# Patient Record
Sex: Male | Born: 1960 | Race: Black or African American | Hispanic: No | Marital: Married | Smoking: Current every day smoker
Health system: Southern US, Community
[De-identification: ages and names within clinical notes are randomized; demographics above are authoritative.]

## PROBLEM LIST (undated history)

## (undated) DIAGNOSIS — I219 Acute myocardial infarction, unspecified: Secondary | ICD-10-CM

## (undated) DIAGNOSIS — M1990 Unspecified osteoarthritis, unspecified site: Secondary | ICD-10-CM

## (undated) DIAGNOSIS — K219 Gastro-esophageal reflux disease without esophagitis: Secondary | ICD-10-CM

## (undated) DIAGNOSIS — H409 Unspecified glaucoma: Secondary | ICD-10-CM

## (undated) DIAGNOSIS — I2510 Atherosclerotic heart disease of native coronary artery without angina pectoris: Secondary | ICD-10-CM

## (undated) DIAGNOSIS — J302 Other seasonal allergic rhinitis: Secondary | ICD-10-CM

## (undated) DIAGNOSIS — J45909 Unspecified asthma, uncomplicated: Secondary | ICD-10-CM

## (undated) DIAGNOSIS — I639 Cerebral infarction, unspecified: Secondary | ICD-10-CM

## (undated) DIAGNOSIS — T7840XA Allergy, unspecified, initial encounter: Secondary | ICD-10-CM

## (undated) HISTORY — DX: Acute myocardial infarction, unspecified: I21.9

## (undated) HISTORY — DX: Atherosclerotic heart disease of native coronary artery without angina pectoris: I25.10

## (undated) HISTORY — DX: Allergy, unspecified, initial encounter: T78.40XA

## (undated) HISTORY — PX: STENT PLACEMENT VASCULAR (ARMC HX): HXRAD1737

## (undated) HISTORY — PX: HERNIA REPAIR: SHX51

## (undated) HISTORY — DX: Unspecified glaucoma: H40.9

## (undated) HISTORY — PX: EYE SURGERY: SHX253

## (undated) HISTORY — DX: Cerebral infarction, unspecified: I63.9

## (undated) HISTORY — DX: Gastro-esophageal reflux disease without esophagitis: K21.9

## (undated) HISTORY — PX: CARDIAC SURGERY: SHX584

## (undated) HISTORY — DX: Other seasonal allergic rhinitis: J30.2

---

## 2009-02-22 ENCOUNTER — Ambulatory Visit (HOSPITAL_COMMUNITY): Admission: RE | Admit: 2009-02-22 | Discharge: 2009-02-22 | Payer: Self-pay | Admitting: Physical Therapy

## 2009-02-26 ENCOUNTER — Ambulatory Visit (HOSPITAL_COMMUNITY): Admission: RE | Admit: 2009-02-26 | Discharge: 2009-02-26 | Payer: Self-pay | Admitting: Interventional Radiology

## 2009-04-27 ENCOUNTER — Ambulatory Visit: Payer: Self-pay | Admitting: Cardiology

## 2009-04-27 ENCOUNTER — Ambulatory Visit (HOSPITAL_COMMUNITY): Admission: RE | Admit: 2009-04-27 | Discharge: 2009-04-27 | Payer: Self-pay | Admitting: Cardiology

## 2009-06-08 ENCOUNTER — Ambulatory Visit (HOSPITAL_COMMUNITY): Admission: RE | Admit: 2009-06-08 | Discharge: 2009-06-08 | Payer: Self-pay | Admitting: Interventional Radiology

## 2010-04-22 LAB — BASIC METABOLIC PANEL
BUN: 8 mg/dL (ref 6–23)
Calcium: 10.3 mg/dL (ref 8.4–10.5)
GFR calc non Af Amer: >60 mL/min (ref >60)

## 2010-04-22 LAB — DIFFERENTIAL
Basophils Absolute: 0.1 10*3/uL (ref 0.0–0.1)
Eosinophils Absolute: 0.1 10*3/uL (ref 0.0–0.7)
Eosinophils Relative: 1 % (ref 0–5)
Lymphs Abs: 3.4 10*3/uL (ref 0.7–4.0)
Neutro Abs: 9.1 10*3/uL — ABNORMAL HIGH (ref 1.7–7.7)
Neutrophils Relative %: 69 % (ref 43–77)

## 2010-04-22 LAB — PROTIME-INR: Prothrombin Time: 13.2 seconds (ref 11.6–15.2)

## 2010-04-22 LAB — CBC
Platelets: 170 10*3/uL (ref 150–400)
RBC: 4.96 MIL/uL (ref 4.22–5.81)
WBC: 13.3 10*3/uL — ABNORMAL HIGH (ref 4.0–10.5)

## 2010-04-23 LAB — CREATININE, SERUM: GFR calc Af Amer: >60 mL/min (ref >60)

## 2010-04-29 LAB — CBC
Hemoglobin: 14.7 g/dL (ref 13.0–17.0)
MCV: 93.2 fL (ref 78.0–100.0)
Platelets: 168 10*3/uL (ref 150–400)
RDW: 12.5 % (ref 11.5–15.5)

## 2010-04-29 LAB — PROTIME-INR: Prothrombin Time: 13 seconds (ref 11.6–15.2)

## 2010-04-29 LAB — BASIC METABOLIC PANEL
BUN: 12 mg/dL (ref 6–23)
CO2: 29 mEq/L (ref 19–32)
Chloride: 105 mEq/L (ref 96–112)
Creatinine, Ser: 0.86 mg/dL (ref 0.4–1.5)
Potassium: 4.1 mEq/L (ref 3.5–5.1)

## 2010-07-19 ENCOUNTER — Other Ambulatory Visit (HOSPITAL_COMMUNITY): Payer: Self-pay | Admitting: Interventional Radiology

## 2010-07-19 DIAGNOSIS — I771 Stricture of artery: Secondary | ICD-10-CM

## 2010-07-22 ENCOUNTER — Other Ambulatory Visit (HOSPITAL_COMMUNITY): Payer: Self-pay | Admitting: Interventional Radiology

## 2010-07-22 ENCOUNTER — Ambulatory Visit (HOSPITAL_COMMUNITY)
Admission: RE | Admit: 2010-07-22 | Discharge: 2010-07-22 | Disposition: A | Discharge status: Home / Self Care | Payer: BC Managed Care – PPO | Source: Ambulatory Visit | Attending: Interventional Radiology | Admitting: Interventional Radiology

## 2010-07-22 DIAGNOSIS — I771 Stricture of artery: Secondary | ICD-10-CM

## 2010-07-24 ENCOUNTER — Other Ambulatory Visit (HOSPITAL_COMMUNITY): Payer: Self-pay | Admitting: Interventional Radiology

## 2010-07-24 ENCOUNTER — Ambulatory Visit (HOSPITAL_COMMUNITY)
Admission: RE | Admit: 2010-07-24 | Discharge: 2010-07-24 | Disposition: A | Discharge status: Home / Self Care | Payer: BC Managed Care – PPO | Source: Ambulatory Visit | Attending: Interventional Radiology | Admitting: Interventional Radiology

## 2010-07-24 DIAGNOSIS — J449 Chronic obstructive pulmonary disease, unspecified: Secondary | ICD-10-CM | POA: Insufficient documentation

## 2010-07-24 DIAGNOSIS — I658 Occlusion and stenosis of other precerebral arteries: Secondary | ICD-10-CM | POA: Insufficient documentation

## 2010-07-24 DIAGNOSIS — R7309 Other abnormal glucose: Secondary | ICD-10-CM | POA: Insufficient documentation

## 2010-07-24 DIAGNOSIS — I6529 Occlusion and stenosis of unspecified carotid artery: Secondary | ICD-10-CM | POA: Insufficient documentation

## 2010-07-24 DIAGNOSIS — Z79899 Other long term (current) drug therapy: Secondary | ICD-10-CM | POA: Insufficient documentation

## 2010-07-24 DIAGNOSIS — I771 Stricture of artery: Secondary | ICD-10-CM

## 2010-07-24 DIAGNOSIS — Z7982 Long term (current) use of aspirin: Secondary | ICD-10-CM | POA: Insufficient documentation

## 2010-07-24 DIAGNOSIS — Z7902 Long term (current) use of antithrombotics/antiplatelets: Secondary | ICD-10-CM | POA: Insufficient documentation

## 2010-07-24 DIAGNOSIS — F17200 Nicotine dependence, unspecified, uncomplicated: Secondary | ICD-10-CM | POA: Insufficient documentation

## 2010-07-24 DIAGNOSIS — J4489 Other specified chronic obstructive pulmonary disease: Secondary | ICD-10-CM | POA: Insufficient documentation

## 2010-07-24 DIAGNOSIS — R51 Headache: Secondary | ICD-10-CM | POA: Insufficient documentation

## 2010-07-24 LAB — CBC
HCT: 43.9 % (ref 39.0–52.0)
MCH: 30.6 pg (ref 26.0–34.0)
MCHC: 35.3 g/dL (ref 30.0–36.0)
RBC: 5.06 MIL/uL (ref 4.22–5.81)

## 2010-07-24 LAB — PROTIME-INR: Prothrombin Time: 12.8 seconds (ref 11.6–15.2)

## 2010-07-24 LAB — POCT I-STAT, CHEM 8
Calcium, Ion: 1.2 mmol/L (ref 1.12–1.32)
Glucose, Bld: 157 mg/dL — ABNORMAL HIGH (ref 70–99)
HCT: 48 % (ref 39.0–52.0)
TCO2: 24 mmol/L (ref 0–100)

## 2010-07-24 MED ORDER — IOHEXOL 300 MG/ML  SOLN
150.0000 mL | Freq: Once | INTRAMUSCULAR | Status: AC | PRN
  Administered 2010-07-24: 80 mL via INTRAVENOUS

## 2010-07-25 ENCOUNTER — Other Ambulatory Visit (HOSPITAL_COMMUNITY): Payer: Self-pay | Admitting: Interventional Radiology

## 2010-07-25 DIAGNOSIS — I771 Stricture of artery: Secondary | ICD-10-CM

## 2010-07-30 ENCOUNTER — Encounter (HOSPITAL_COMMUNITY)
Admission: RE | Admit: 2010-07-30 | Discharge: 2010-07-30 | Disposition: A | Discharge status: Home / Self Care | Payer: BC Managed Care – PPO | Source: Ambulatory Visit | Attending: Interventional Radiology | Admitting: Interventional Radiology

## 2010-07-30 ENCOUNTER — Other Ambulatory Visit (HOSPITAL_COMMUNITY): Payer: Self-pay | Admitting: Interventional Radiology

## 2010-07-30 ENCOUNTER — Ambulatory Visit (HOSPITAL_COMMUNITY)
Admission: RE | Admit: 2010-07-30 | Discharge: 2010-07-30 | Disposition: A | Discharge status: Home / Self Care | Payer: BC Managed Care – PPO | Source: Ambulatory Visit | Attending: Interventional Radiology | Admitting: Interventional Radiology

## 2010-07-30 DIAGNOSIS — Z01812 Encounter for preprocedural laboratory examination: Secondary | ICD-10-CM | POA: Insufficient documentation

## 2010-07-30 DIAGNOSIS — I6509 Occlusion and stenosis of unspecified vertebral artery: Secondary | ICD-10-CM

## 2010-07-30 DIAGNOSIS — Z01818 Encounter for other preprocedural examination: Secondary | ICD-10-CM | POA: Insufficient documentation

## 2010-07-30 DIAGNOSIS — I10 Essential (primary) hypertension: Secondary | ICD-10-CM | POA: Insufficient documentation

## 2010-07-30 LAB — PROTIME-INR: Prothrombin Time: 13 seconds (ref 11.6–15.2)

## 2010-07-30 LAB — BASIC METABOLIC PANEL
BUN: 9 mg/dL (ref 6–23)
CO2: 28 mEq/L (ref 19–32)
GFR calc non Af Amer: >60 mL/min (ref >60)
Glucose, Bld: 69 mg/dL — ABNORMAL LOW (ref 70–99)
Potassium: 3.5 mEq/L (ref 3.5–5.1)
Sodium: 139 mEq/L (ref 135–145)

## 2010-07-30 LAB — SURGICAL PCR SCREEN: Staphylococcus aureus: NEGATIVE

## 2010-07-30 LAB — DIFFERENTIAL
Basophils Relative: 0 % (ref 0–1)
Eosinophils Absolute: 0.4 10*3/uL (ref 0.0–0.7)
Eosinophils Relative: 6 % — ABNORMAL HIGH (ref 0–5)
Neutrophils Relative %: 40 % — ABNORMAL LOW (ref 43–77)

## 2010-07-30 LAB — CBC
MCH: 30.9 pg (ref 26.0–34.0)
Platelets: 179 10*3/uL (ref 150–400)
RBC: 4.98 MIL/uL (ref 4.22–5.81)
RDW: 12.6 % (ref 11.5–15.5)
WBC: 7.4 10*3/uL (ref 4.0–10.5)

## 2010-07-30 LAB — PLATELET INHIBITION P2Y12
P2Y12 % Inhibition: 4 %
Platelet Function  P2Y12: 209 [PRU] (ref 194–418)

## 2010-07-31 ENCOUNTER — Inpatient Hospital Stay (HOSPITAL_COMMUNITY)
Admission: RE | Admit: 2010-07-31 | Discharge: 2010-08-01 | DRG: 838 | Disposition: A | Discharge status: Home / Self Care | Payer: BC Managed Care – PPO | Source: Ambulatory Visit | Attending: Interventional Radiology | Admitting: Interventional Radiology

## 2010-07-31 ENCOUNTER — Ambulatory Visit (HOSPITAL_COMMUNITY)
Admission: RE | Admit: 2010-07-31 | Discharge: 2010-07-31 | Disposition: A | Discharge status: Home / Self Care | Payer: BC Managed Care – PPO | Source: Ambulatory Visit | Attending: Interventional Radiology | Admitting: Interventional Radiology

## 2010-07-31 DIAGNOSIS — F17200 Nicotine dependence, unspecified, uncomplicated: Secondary | ICD-10-CM | POA: Diagnosis present

## 2010-07-31 DIAGNOSIS — I10 Essential (primary) hypertension: Secondary | ICD-10-CM | POA: Diagnosis present

## 2010-07-31 DIAGNOSIS — J4489 Other specified chronic obstructive pulmonary disease: Secondary | ICD-10-CM | POA: Diagnosis present

## 2010-07-31 DIAGNOSIS — I6509 Occlusion and stenosis of unspecified vertebral artery: Principal | ICD-10-CM | POA: Diagnosis present

## 2010-07-31 DIAGNOSIS — J449 Chronic obstructive pulmonary disease, unspecified: Secondary | ICD-10-CM | POA: Diagnosis present

## 2010-07-31 DIAGNOSIS — Z7902 Long term (current) use of antithrombotics/antiplatelets: Secondary | ICD-10-CM

## 2010-07-31 DIAGNOSIS — E785 Hyperlipidemia, unspecified: Secondary | ICD-10-CM | POA: Diagnosis present

## 2010-07-31 DIAGNOSIS — Z7982 Long term (current) use of aspirin: Secondary | ICD-10-CM

## 2010-07-31 DIAGNOSIS — I771 Stricture of artery: Secondary | ICD-10-CM

## 2010-07-31 DIAGNOSIS — Z8673 Personal history of transient ischemic attack (TIA), and cerebral infarction without residual deficits: Secondary | ICD-10-CM

## 2010-07-31 LAB — POCT ACTIVATED CLOTTING TIME
Activated Clotting Time: 188 seconds
Activated Clotting Time: 193 s

## 2010-07-31 LAB — CBC
MCH: 30.1 pg (ref 26.0–34.0)
MCHC: 34.3 g/dL (ref 30.0–36.0)
MCV: 87.6 fL (ref 78.0–100.0)
Platelets: 157 10*3/uL (ref 150–400)
RBC: 4.12 MIL/uL — ABNORMAL LOW (ref 4.22–5.81)
RDW: 12.6 % (ref 11.5–15.5)

## 2010-07-31 LAB — HEPARIN LEVEL (UNFRACTIONATED): Heparin Unfractionated: <0.1 IU/mL — ABNORMAL LOW (ref 0.30–0.70)

## 2010-07-31 LAB — GLUCOSE, CAPILLARY: Glucose-Capillary: 199 mg/dL — ABNORMAL HIGH (ref 70–99)

## 2010-07-31 MED ORDER — IOHEXOL 300 MG/ML  SOLN
300.0000 mL | Freq: Once | INTRAMUSCULAR | Status: AC | PRN
  Administered 2010-07-31: 150 mL via INTRAVENOUS

## 2010-08-01 LAB — BASIC METABOLIC PANEL
BUN: 11 mg/dL (ref 6–23)
CO2: 25 mEq/L (ref 19–32)
GFR calc non Af Amer: >60 mL/min (ref >60)
Glucose, Bld: 294 mg/dL — ABNORMAL HIGH (ref 70–99)
Potassium: 3.6 mEq/L (ref 3.5–5.1)

## 2010-08-01 LAB — PROTIME-INR
INR: 0.95 (ref 0.00–1.49)
Prothrombin Time: 12.9 seconds (ref 11.6–15.2)

## 2010-08-01 LAB — CBC
MCH: 30.4 pg (ref 26.0–34.0)
MCHC: 34.1 g/dL (ref 30.0–36.0)
Platelets: 156 10*3/uL (ref 150–400)
RDW: 12.8 % (ref 11.5–15.5)

## 2010-08-08 NOTE — H&P (Signed)
  NAME:  Kyle Gray, Kyle Gray NO.:  [id number]  MEDICAL RECORD NO.:  [id number]  LOCATION:  3106                         FACILITY:  [facility]  PHYSICIAN:  Dhanvi Boesen K. Ishana Blades, M.D.DATE OF BIRTH:  08-25-1960  DATE OF ADMISSION:  07/31/2010 DATE OF DISCHARGE:                             HISTORY & PHYSICAL   SUBJECTIVE:  A 50 year old male with history of TIAs/left cerebrovascular accident.  He has right vertebral artery stenosis.  He is scheduled today for cerebral arteriogram and probable right vertebral artery angioplasty/stent placement with [name].  The patient is aware of procedure benefits and risks.  He is agreeable to proceed.  PAST MEDICAL HISTORY: 1. CVA. 2. TIAs. 3. High lipids or hyperlipidemia. 4. Hypertension. 5. Smoker.  SURGICAL HISTORY:  Right eye surgery and wisdom teeth removal.  MEDICATIONS: 1. Ranitidine 150 mg. 2. Plavix 75 mg. 3. Norvasc 10 mg. 4. Lisinopril 5 mg. 5. Hydrochlorothiazide 25 mg. 6. Foltabs 800 mg. 7. Crestor 20 mg. 8. Aspirin 81 mg. 9. Albuterol inhaler as needed.  ALLERGIES:  None.  OBJECTIVE:  HEENT:  Extraocular movements intact.  Face is symmetrical; smile is equal bilaterally. GENERAL:  Alert and oriented; appropriate. HEART:  Regular rate and rhythm without murmur. LUNGS:  Clear to auscultation. ABDOMEN:  Positive bowel sounds; nontender; no masses. EXTREMITIES:  Full range of motion; gait is steady.  ASSESSMENT:  Right vertebral artery stenosis.  PLAN: 1. Cerebral arteriogram and possible angioplasty and/or stent     placement with [name]. 2. The patient to be admitted to Neuro ICU if kept overnight and     probably discharge following day.     [place] [facility] Carleene Mains, P.A.   ______________________________ [place] Silos [name], M.D.    PAT/MEDQ  D:  07/31/2010  T:  08/01/2010  Job:  [id number]  Electronically Signed by Ralene Muskrat P.A. on 08/02/2010 04:42:05 PM Electronically Signed by  Julieanne Cotton M.D. on 08/08/2010 11:27:59 AM

## 2010-08-08 NOTE — Discharge Summary (Signed)
  NAME:  Kyle Gray, PETITJEAN NO.:  [id number]  MEDICAL RECORD NO.:  [id number]  LOCATION:                                 FACILITY:  PHYSICIAN:  Ryelan Kazee K. Bari Leib, M.D.DATE OF BIRTH:  07/14/1960  DATE OF ADMISSION: DATE OF DISCHARGE:                              DISCHARGE SUMMARY   SUBJECTIVE:  A 50 year old male with right vertebral artery stenosis. History of stroke and TIA.  A right vertebral artery stent was placed on July 31, 2010, by Dr. Julieanne Cotton.  The patient tolerated the procedure well and without complication.  Overnight stay was uncomplicated.  He is eating well with no nausea or vomiting.  He slept well.  Complains of no headache or no pain or no visual abnormalities. He is urinating well.  OBJECTIVE:  VITAL SIGNS:  Temperatures 99 degrees, heart rate is 72, respirations 18, 99% on room air, blood pressure 135/67.  Urine output was 1 liter on July 31, 2010, and 1.5 liters on August 01, 2010.  Urine is yellow and clear.  DISCHARGE LABORATORY DATA:  BUN 11, creatinine 0.8, potassium 3.6. White count is 16.3 (the patient was premedicated with steroids for CONTRAST allergy), hemoglobin 12.9, hematocrit 37.8, platelets 156.  Pro time 12.9, INR 0.95, PTT is 29.  Alert and oriented; extraocular movements intact; appropriate.  Face is symmetrical, smile is equal, puffs cheeks equally, tongue was midline. Good grip bilaterally and equal.  Good strength bilaterally and equal. Pushes and pulls both feet equally bilaterally.  Right groin is nontender.  No bleeding, no hematoma.  Right foot has 2+ pulses.  MEDICATIONS:  Discharge medications: 1. Albuterol inhaler 2 puffs every 4 hours as needed. 2. Aspirin 81 mg. 3. Crestor 20 mg. 4. Foltabs 800 mg. 5. Hydrochlorothiazide 25 mg. 6. Lisinopril 5 mg. 7. Norvasc 10 mg. 8. Plavix 75 mg. 9. Ranitidine 150 mg.  ASSESSMENT:  Right vertebral artery stenosis/stent placement on July 31, 2010, with Dr.  [name].  DISCHARGE INSTRUCTIONS:  No driving for 2 weeks.  No lifting for 2 weeks.  No stooping or sudden movement of neck for two weeks.  The patient may bathe today.  Diet is low fat and low sodium.  He is to return to clinic in 2 weeks to see [name] in followup.  Scheduler will call the patient with time and date of that followup appointment.  DISCHARGE DIAGNOSIS:  Right vertebral artery stenosis with right vertebral artery stent placement on July 31, 2010, by [name].     [place] [facility] Carleene Mains, P.A.   ______________________________ [place] Silos [name], M.D.    PAT/MEDQ  D:  08/01/2010  T:  08/02/2010  Job:  [id number]  Electronically Signed by Ralene Muskrat P.A. on 08/02/2010 04:43:45 PM Electronically Signed by Julieanne Cotton M.D. on 08/08/2010 11:28:02 AM

## 2010-08-15 ENCOUNTER — Other Ambulatory Visit: Payer: Self-pay | Admitting: Physician Assistant

## 2010-08-15 ENCOUNTER — Other Ambulatory Visit (HOSPITAL_COMMUNITY): Payer: Self-pay | Admitting: Interventional Radiology

## 2010-08-15 ENCOUNTER — Ambulatory Visit (HOSPITAL_COMMUNITY)
Admit: 2010-08-15 | Discharge: 2010-08-15 | Disposition: A | Discharge status: Home / Self Care | Payer: BC Managed Care – PPO | Source: Ambulatory Visit | Attending: Interventional Radiology | Admitting: Interventional Radiology

## 2010-08-15 ENCOUNTER — Ambulatory Visit (HOSPITAL_COMMUNITY)
Admission: RE | Admit: 2010-08-15 | Discharge: 2010-08-15 | Disposition: A | Discharge status: Home / Self Care | Payer: BC Managed Care – PPO | Source: Ambulatory Visit | Attending: Interventional Radiology | Admitting: Interventional Radiology

## 2010-08-15 DIAGNOSIS — I739 Peripheral vascular disease, unspecified: Secondary | ICD-10-CM

## 2010-08-15 DIAGNOSIS — I723 Aneurysm of iliac artery: Secondary | ICD-10-CM | POA: Insufficient documentation

## 2010-08-15 DIAGNOSIS — I708 Atherosclerosis of other arteries: Secondary | ICD-10-CM | POA: Insufficient documentation

## 2010-08-15 MED ORDER — GADOBENATE DIMEGLUMINE 529 MG/ML IV SOLN
30.0000 mL | Freq: Once | INTRAVENOUS | Status: AC | PRN
  Administered 2010-08-15: 30 mL via INTRAVENOUS

## 2011-01-06 ENCOUNTER — Encounter (HOSPITAL_COMMUNITY): Payer: Self-pay

## 2011-01-06 NOTE — Progress Notes (Signed)
Patient is scheduled for an US of the corotids at [place] on 01-15-11.

## 2013-04-06 DIAGNOSIS — IMO0002 Reserved for concepts with insufficient information to code with codable children: Secondary | ICD-10-CM | POA: Insufficient documentation

## 2013-04-06 DIAGNOSIS — I10 Essential (primary) hypertension: Secondary | ICD-10-CM

## 2013-04-06 DIAGNOSIS — I6789 Other cerebrovascular disease: Secondary | ICD-10-CM | POA: Insufficient documentation

## 2013-04-06 HISTORY — DX: Reserved for concepts with insufficient information to code with codable children: IMO0002

## 2013-04-06 HISTORY — DX: Essential (primary) hypertension: I10

## 2013-04-06 HISTORY — DX: Other cerebrovascular disease: I67.89

## 2013-06-03 DIAGNOSIS — E785 Hyperlipidemia, unspecified: Secondary | ICD-10-CM | POA: Insufficient documentation

## 2013-06-03 HISTORY — DX: Hyperlipidemia, unspecified: E78.5

## 2014-10-18 DIAGNOSIS — I471 Supraventricular tachycardia, unspecified: Secondary | ICD-10-CM | POA: Insufficient documentation

## 2014-10-18 DIAGNOSIS — I2510 Atherosclerotic heart disease of native coronary artery without angina pectoris: Secondary | ICD-10-CM | POA: Insufficient documentation

## 2014-10-18 HISTORY — DX: Supraventricular tachycardia, unspecified: I47.10

## 2014-10-18 HISTORY — DX: Supraventricular tachycardia: I47.1

## 2014-10-18 HISTORY — DX: Atherosclerotic heart disease of native coronary artery without angina pectoris: I25.10

## 2015-05-14 DIAGNOSIS — M542 Cervicalgia: Secondary | ICD-10-CM | POA: Insufficient documentation

## 2015-05-14 DIAGNOSIS — M5116 Intervertebral disc disorders with radiculopathy, lumbar region: Secondary | ICD-10-CM

## 2015-05-14 DIAGNOSIS — M545 Low back pain, unspecified: Secondary | ICD-10-CM | POA: Insufficient documentation

## 2015-05-14 DIAGNOSIS — G5600 Carpal tunnel syndrome, unspecified upper limb: Secondary | ICD-10-CM

## 2015-05-14 DIAGNOSIS — I6509 Occlusion and stenosis of unspecified vertebral artery: Secondary | ICD-10-CM | POA: Insufficient documentation

## 2015-05-14 HISTORY — DX: Carpal tunnel syndrome, unspecified upper limb: G56.00

## 2015-05-14 HISTORY — DX: Occlusion and stenosis of unspecified vertebral artery: I65.09

## 2015-05-14 HISTORY — DX: Intervertebral disc disorders with radiculopathy, lumbar region: M51.16

## 2015-05-14 HISTORY — DX: Cervicalgia: M54.2

## 2015-05-14 HISTORY — DX: Low back pain, unspecified: M54.50

## 2015-08-27 HISTORY — PX: COLONOSCOPY: SHX174

## 2015-12-14 HISTORY — PX: ESOPHAGOGASTRODUODENOSCOPY: SHX1529

## 2016-07-16 DIAGNOSIS — I723 Aneurysm of iliac artery: Secondary | ICD-10-CM | POA: Insufficient documentation

## 2016-07-16 DIAGNOSIS — I6523 Occlusion and stenosis of bilateral carotid arteries: Secondary | ICD-10-CM

## 2016-07-16 HISTORY — DX: Occlusion and stenosis of bilateral carotid arteries: I65.23

## 2016-07-16 HISTORY — DX: Aneurysm of iliac artery: I72.3

## 2017-05-27 ENCOUNTER — Telehealth: Payer: Self-pay

## 2017-05-27 NOTE — Telephone Encounter (Signed)
Pantoprazole 40 mg p.o. once a day 30 with 6 refills 

## 2017-05-27 NOTE — Telephone Encounter (Signed)
Patient wants refills on Pantoprazole, would you like to give any?  

## 2017-06-01 MED ORDER — PANTOPRAZOLE SODIUM 40 MG PO TBEC: 40.0000 mg | DELAYED_RELEASE_TABLET | Freq: Every day | ORAL | 6 refills | Status: DC

## 2017-06-01 NOTE — Telephone Encounter (Signed)
Sent refills to patients pharmacy.  

## 2018-06-11 ENCOUNTER — Telehealth: Payer: Self-pay | Admitting: *Deleted

## 2018-06-11 ENCOUNTER — Other Ambulatory Visit: Payer: Self-pay

## 2018-06-11 DIAGNOSIS — E876 Hypokalemia: Secondary | ICD-10-CM | POA: Insufficient documentation

## 2018-06-11 DIAGNOSIS — R0789 Other chest pain: Secondary | ICD-10-CM

## 2018-06-11 DIAGNOSIS — I119 Hypertensive heart disease without heart failure: Secondary | ICD-10-CM | POA: Insufficient documentation

## 2018-06-11 DIAGNOSIS — R059 Cough, unspecified: Secondary | ICD-10-CM

## 2018-06-11 DIAGNOSIS — R0602 Shortness of breath: Secondary | ICD-10-CM

## 2018-06-11 DIAGNOSIS — I739 Peripheral vascular disease, unspecified: Secondary | ICD-10-CM

## 2018-06-11 DIAGNOSIS — R05 Cough: Secondary | ICD-10-CM | POA: Insufficient documentation

## 2018-06-11 DIAGNOSIS — E119 Type 2 diabetes mellitus without complications: Secondary | ICD-10-CM

## 2018-06-11 HISTORY — DX: Shortness of breath: R06.02

## 2018-06-11 HISTORY — DX: Cough, unspecified: R05.9

## 2018-06-11 HISTORY — DX: Hypokalemia: E87.6

## 2018-06-11 HISTORY — DX: Hypertensive heart disease without heart failure: I11.9

## 2018-06-11 HISTORY — DX: Peripheral vascular disease, unspecified: I73.9

## 2018-06-11 HISTORY — DX: Type 2 diabetes mellitus without complications: E11.9

## 2018-06-11 HISTORY — DX: Other chest pain: R07.89

## 2018-06-11 NOTE — Telephone Encounter (Signed)
YOUR CARDIOLOGY TEAM HAS ARRANGED FOR AN E-VISIT FOR YOUR APPOINTMENT - PLEASE REVIEW IMPORTANT INFORMATION BELOW SEVERAL DAYS PRIOR TO YOUR APPOINTMENT  Due to the recent COVID-19 pandemic, we are transitioning in-person office visits to tele-medicine visits in an effort to decrease unnecessary exposure to our patients, their families, and staff. These visits are billed to your insurance just like a normal visit is. We also encourage you to sign up for MyChart if you have not already done so. You will need a smartphone if possible. For patients that do not have this, we can still complete the visit using a regular telephone but do prefer a smartphone to enable video when possible. You may have a family member that lives with you that can help. If possible, we also ask that you have a blood pressure cuff and scale at home to measure your blood pressure, heart rate and weight prior to your scheduled appointment. Patients with clinical needs that need an in-person evaluation and testing will still be able to come to the office if absolutely necessary. If you have any questions, feel free to call our office.     YOUR PROVIDER WILL BE USING THE FOLLOWING PLATFORM TO COMPLETE YOUR VISIT: Staff: Please delete this text and fill in MyChart/[email or web address]  . IF USING MYCHART - How to Download the MyChart App to Your SmartPhone   - If Apple, go to App Store and type in MyChart in the search bar and download the app. If Android, ask patient to go to Google Play Store and type in MyChart in the search bar and download the app. The app is free but as with any other app downloads, your phone may require you to verify saved payment information or Apple/Android password.  - You will need to then log into the app with your MyChart username and password, and select [place] as your healthcare provider to link the account.  - When it is time for your visit, go to the MyChart app, find appointments, and click Begin  Video Visit. Be sure to Select Allow for your device to access the Microphone and Camera for your visit. You will then be connected, and your provider will be with you shortly.  **If you have any issues connecting or need assistance, please contact MyChart service desk [phone] ([phone])**  **If using a computer, in order to ensure the best quality for your visit, you will need to use either of the following Internet Browsers: Google Chrome or Microsoft Edge**  . IF USING DOXIMITY or [email or web address].ME - The staff will give you instructions on receiving your link to join the meeting the day of your visit.      2-3 DAYS BEFORE YOUR APPOINTMENT  You will receive a telephone call from one of our [facility] team members - your caller ID may say "Unknown caller." If this is a video visit, we will walk you through how to get the video launched on your phone. We will remind you check your blood pressure, heart rate and weight prior to your scheduled appointment. If you have an Apple Watch or Kardia, please upload any pertinent ECG strips the day before or morning of your appointment to MyChart. Our staff will also make sure you have reviewed the consent and agree to move forward with your scheduled tele-health visit.     THE DAY OF YOUR APPOINTMENT  Approximately 15 minutes prior to your scheduled appointment, you will receive a telephone call from one of Heart[facility] team -   your caller ID may say "Unknown caller."  Our staff will confirm medications, vital signs for the day and any symptoms you may be experiencing. Please have this information available prior to the time of visit start. It may also be helpful for you to have a pad of paper and pen handy for any instructions given during your visit. They will also walk you through joining the smartphone meeting if this is a video visit.    CONSENT FOR TELE-HEALTH VISIT - PLEASE REVIEW  I hereby voluntarily request, consent and authorize [facility] and  its employed or contracted physicians, physician assistants, nurse practitioners or other licensed health care professionals (the Practitioner), to provide me with telemedicine health care services (the "Services") as deemed necessary by the treating Practitioner. I acknowledge and consent to receive the Services by the Practitioner via telemedicine. I understand that the telemedicine visit will involve communicating with the Practitioner through live audiovisual communication technology and the disclosure of certain medical information by electronic transmission. I acknowledge that I have been given the opportunity to request an in-person assessment or other available alternative prior to the telemedicine visit and am voluntarily participating in the telemedicine visit.  I understand that I have the right to withhold or withdraw my consent to the use of telemedicine in the course of my care at any time, without affecting my right to future care or treatment, and that the Practitioner or I may terminate the telemedicine visit at any time. I understand that I have the right to inspect all information obtained and/or recorded in the course of the telemedicine visit and may receive copies of available information for a reasonable fee.  I understand that some of the potential risks of receiving the Services via telemedicine include:  Marland Kitchen Delay or interruption in medical evaluation due to technological equipment failure or disruption; . Information transmitted may not be sufficient (e.g. poor resolution of images) to allow for appropriate medical decision making by the Practitioner; and/or  . In rare instances, security protocols could fail, causing a breach of personal health information.  Furthermore, I acknowledge that it is my responsibility to provide information about my medical history, conditions and care that is complete and accurate to the best of my ability. I acknowledge that Practitioner's advice,  recommendations, and/or decision may be based on factors not within their control, such as incomplete or inaccurate data provided by me or distortions of diagnostic images or specimens that may result from electronic transmissions. I understand that the practice of medicine is not an exact science and that Practitioner makes no warranties or guarantees regarding treatment outcomes. I acknowledge that I will receive a copy of this consent concurrently upon execution via email to the email address I last provided but may also request a printed copy by calling the office of [facility].    I understand that my insurance will be billed for this visit.   I have read or had this consent read to me. . I understand the contents of this consent, which adequately explains the benefits and risks of the Services being provided via telemedicine.  . I have been provided ample opportunity to ask questions regarding this consent and the Services and have had my questions answered to my satisfaction. . I give my informed consent for the services to be provided through the use of telemedicine in my medical care Pt gives consent to virtual visit.  By participating in this telemedicine visit I agree to the above.  Cardiac Questionnaire:    Since your last visit or hospitalization:    1. Have you been having new or worsening chest pain? Yes, that's why he was in the ED   2. Have you been having new or worsening shortness of breath? Yes 3. Have you been having new or worsening leg swelling, wt gain, or increase in abdominal girth (pants fitting more tightly)? no   4. Have you had any passing out spells? no    *A YES to any of these questions would result in the appointment being kept. *If all the answers to these questions are NO, we should indicate that given the current situation regarding the worldwide coronarvirus pandemic, at the recommendation of the CDC, we are looking to limit gatherings in our waiting  area, and thus will reschedule their appointment beyond four weeks from today.   _____________   COVID-19 Pre-Screening Questions:  . Do you currently have a fever? no (yes = cancel and refer to pcp for e-visit) . Have you recently travelled on a cruise, internationally, or to [place], [place], [place], [place], [place], or [place], [place] [employer]) ? no (yes = cancel, stay home, monitor symptoms, and contact pcp or initiate e-visit if symptoms develop) . Have you been in contact with someone that is currently pending confirmation of Covid19 testing or has been confirmed to have the Covid19 virus?  no (yes = cancel, stay home, away from tested individual, monitor symptoms, and contact pcp or initiate e-visit if symptoms develop) . Are you currently experiencing fatigue or cough? no (yes = pt should be prepared to have a mask placed at the time of their visit).

## 2018-06-17 ENCOUNTER — Encounter: Payer: Self-pay | Admitting: *Deleted

## 2018-06-17 ENCOUNTER — Encounter: Payer: Self-pay | Admitting: Cardiology

## 2018-06-17 ENCOUNTER — Telehealth (INDEPENDENT_AMBULATORY_CARE_PROVIDER_SITE_OTHER): Payer: BLUE CROSS/BLUE SHIELD | Admitting: Cardiology

## 2018-06-17 ENCOUNTER — Other Ambulatory Visit: Payer: Self-pay

## 2018-06-17 VITALS — BP 125/90 | HR 78 | Ht 68.0 in | Wt 231.0 lb

## 2018-06-17 DIAGNOSIS — E088 Diabetes mellitus due to underlying condition with unspecified complications: Secondary | ICD-10-CM

## 2018-06-17 DIAGNOSIS — L209 Atopic dermatitis, unspecified: Secondary | ICD-10-CM

## 2018-06-17 DIAGNOSIS — J029 Acute pharyngitis, unspecified: Secondary | ICD-10-CM

## 2018-06-17 DIAGNOSIS — E860 Dehydration: Secondary | ICD-10-CM

## 2018-06-17 DIAGNOSIS — R079 Chest pain, unspecified: Secondary | ICD-10-CM

## 2018-06-17 DIAGNOSIS — R0789 Other chest pain: Secondary | ICD-10-CM

## 2018-06-17 DIAGNOSIS — M5430 Sciatica, unspecified side: Secondary | ICD-10-CM | POA: Insufficient documentation

## 2018-06-17 DIAGNOSIS — I10 Essential (primary) hypertension: Secondary | ICD-10-CM | POA: Insufficient documentation

## 2018-06-17 DIAGNOSIS — R42 Dizziness and giddiness: Secondary | ICD-10-CM | POA: Insufficient documentation

## 2018-06-17 DIAGNOSIS — I679 Cerebrovascular disease, unspecified: Secondary | ICD-10-CM

## 2018-06-17 DIAGNOSIS — E782 Mixed hyperlipidemia: Secondary | ICD-10-CM

## 2018-06-17 DIAGNOSIS — I2510 Atherosclerotic heart disease of native coronary artery without angina pectoris: Secondary | ICD-10-CM

## 2018-06-17 DIAGNOSIS — H8110 Benign paroxysmal vertigo, unspecified ear: Secondary | ICD-10-CM

## 2018-06-17 HISTORY — DX: Sciatica, unspecified side: M54.30

## 2018-06-17 HISTORY — DX: Diabetes mellitus due to underlying condition with unspecified complications: E08.8

## 2018-06-17 HISTORY — DX: Atopic dermatitis, unspecified: L20.9

## 2018-06-17 HISTORY — DX: Essential (primary) hypertension: I10

## 2018-06-17 HISTORY — DX: Benign paroxysmal vertigo, unspecified ear: H81.10

## 2018-06-17 HISTORY — DX: Dizziness and giddiness: R42

## 2018-06-17 HISTORY — DX: Dehydration: E86.0

## 2018-06-17 HISTORY — DX: Acute pharyngitis, unspecified: J02.9

## 2018-06-17 HISTORY — DX: Other chest pain: R07.89

## 2018-06-17 MED ORDER — NITROGLYCERIN 0.4 MG SL SUBL: 0.4000 mg | SUBLINGUAL_TABLET | SUBLINGUAL | 11 refills | Status: DC | PRN

## 2018-06-17 NOTE — Progress Notes (Signed)
Virtual Visit via Video Note   This visit type was conducted due to national recommendations for restrictions regarding the COVID-19 Pandemic (e.g. social distancing) in an effort to limit this patient's exposure and mitigate transmission in our community.  Due to his co-morbid illnesses, this patient is at least at moderate risk for complications without adequate follow up.  This format is felt to be most appropriate for this patient at this time.  All issues noted in this document were discussed and addressed.  A limited physical exam was performed with this format.  Please refer to the patient's chart for his consent to telehealth for [facility].   Date:  06/17/2018   ID:  Kyle Gray, DOB 10-01-1960, MRN [id number]  Patient Location: Home Provider Location: Office  PCP:  Kyle Berger, MD  Cardiologist:  No primary care provider on file.  Electrophysiologist:  None   Evaluation Performed:  New Patient Evaluation  Chief Complaint: Chest discomfort and post hospital visit  History of Present Illness:    Kyle Gray is a 58 y.o. male with past medical history of essential hypertension, diabetes mellitus, dyslipidemia.  The patient mentions to me that he has cerebrovascular disease and has had stenting in the cerebrovascular circulation.  He also mentions of ablation for arrhythmia in his heart.  I do not have those details.  I reviewed [facility] records and some records from his other cardiologist in the past.  He went to the hospital with chest discomfort.  He was treated and released.  Subsequently is done fine.  He tells me that he has not had another episode.  No chest pain orthopnea or PND.  He leads a sedentary lifestyle and does not exercise on a regular basis.  At the time of my evaluation, the patient is alert awake oriented and in no distress.  The patient does not have symptoms concerning for COVID-19 infection (fever, chills, cough, or new shortness of  breath).    Past Medical History:  Diagnosis Date  . Stroke [facility])    Past Surgical History:  Procedure Laterality Date  . CARDIAC SURGERY    . EYE SURGERY    . HERNIA REPAIR    . STENT PLACEMENT VASCULAR ([facility] HX)       Current Meds  Medication Sig  . amLODipine (NORVASC) 10 MG tablet Take 10 mg by mouth daily.  . ASPIRIN 81 PO Take 81 mg by mouth daily.   . cetirizine (ZYRTEC) 10 MG tablet Take 10 mg by mouth daily as needed.  . clopidogrel (PLAVIX) 75 MG tablet Take 75 mg by mouth daily.  Marland Kitchen losartan-hydrochlorothiazide (HYZAAR) 100-12.5 MG tablet Take 1 tablet by mouth daily.  . metFORMIN (GLUCOPHAGE) 500 MG tablet Take 500 mg by mouth 2 (two) times daily with a meal.   . metoprolol succinate (TOPROL-XL) 50 MG 24 hr tablet Take 50 mg by mouth daily.  . pantoprazole (PROTONIX) 40 MG tablet Take 1 tablet (40 mg total) by mouth daily.  . pantoprazole (PROTONIX) 40 MG tablet Take 40 mg by mouth as needed.   Marland Kitchen REPATHA PUSHTRONEX SYSTEM 420 MG/3.5ML SOCT   . valsartan-hydrochlorothiazide (DIOVAN-HCT) 160-12.5 MG tablet Take 1 tablet by mouth daily.  . [DISCONTINUED] Folic Acid-Vit B6-Vit B12 0.8-10-0.115 MG TABS Take 1 tablet by mouth.  . [DISCONTINUED] lisinopril (ZESTRIL) 20 MG tablet Take 40 mg by mouth daily.     Allergies:   Hydrocodone; Oxycodone; and Shellfish allergy   Social History   Tobacco Use  .  Smoking status: Current Every Day Smoker  . Smokeless tobacco: Never Used  Substance Use Topics  . Alcohol use: Not on file  . Drug use: Not on file     Family Hx: The patient's family history includes Heart attack in his mother; Stroke in his father and maternal grandfather.  ROS:   Please see the history of present illness.    As mentioned above All other systems reviewed and are negative.   Prior CV studies:   The following studies were reviewed today:  I reviewed [facility] hospital records including blood work extensively and discussed with the patient next   Labs/Other Tests and Data Reviewed:    EKG:  EKG done on 06/10/2018 at [facility] hospitalist recently revealed sinus rhythm lateral wall myocardial infarction and nonspecific ST-T changes  Recent Labs: No results found for requested labs within last 8760 hours.   Recent Lipid Panel No results found for: CHOL, TRIG, HDL, CHOLHDL, LDLCALC, LDLDIRECT  Wt Readings from Last 3 Encounters:  06/17/18 231 lb (104.8 kg)     Objective:    Vital Signs:  BP 125/90 (BP Location: Left Arm, Patient Position: Sitting, Cuff Size: Normal)   Pulse 78   Ht 5\' 8"  (1.727 m)   Wt 231 lb (104.8 kg)   BMI 35.12 kg/m    VITAL SIGNS:  reviewed  ASSESSMENT & PLAN:    1. Chest discomfort: Sublingual nitroglycerin prescription was sent, its protocol and 911 protocol explained and the patient vocalized understanding questions were answered to the patient's satisfaction.  Fortunately has had no symptoms after hospital discharge.  I told him to be active and start walking around the house and increase his exercise in a graded fashion.  If he has any chest discomfort he knows to use nitroglycerin.  I also explained to him that he can come to the emergency room for any significant symptoms. 2. Coronary artery disease: Secondary prevention stressed with the patient.  Importance of compliance with diet and medication stressed and he vocalized understanding. 3. Essential hypertension: Blood pressure stable 4. Diabetes mellitus and mixed dyslipidemia: Diet was discussed.  Importance of compliance with diet and medication stressed.  His lipids are followed by his primary care physician and will try to get a copy of this from their office. 5. In view of the above symptoms he will have a Lexiscan sestamibi.  Echocardiogram will be done to assess hypertensive cardiovascular disease to see for any endorgan damage.  Weight reduction was stressed with diet and exercise 6. Follow-up appointment in a month or earlier if he has any  concerns.  The above tests will be done in the next couple of weeks as her schedule permits.  COVID-19 Education: The signs and symptoms of COVID-19 were discussed with the patient and how to seek care for testing (follow up with PCP or arrange E-visit).  The importance of social distancing was discussed today.  Time:   Today, I have spent 30 minutes with the patient with telehealth technology discussing the above problems.  Total time for evaluation including review of hospital records amongst other issues were 45 minutes   Medication Adjustments/Labs and Tests Ordered: Current medicines are reviewed at length with the patient today.  Concerns regarding medicines are outlined above.   Tests Ordered: No orders of the defined types were placed in this encounter.   Medication Changes: No orders of the defined types were placed in this encounter.   Disposition:  Follow up in 1 month(s)  Signed,  Garwin Brothers, MD  06/17/2018 9:21 AM    [facility]

## 2018-06-17 NOTE — Patient Instructions (Addendum)
Medication Instructions:  START: Nitroglycerin 0.4 mg sublingual (under your tongue) as needed for chest pain. If experiencing chest pain, stop what you are doing and sit down. Take 1 nitroglycerin and wait 5 minutes. If chest pain continues, take another nitroglycerin and wait 5 minutes. If chest pain does not subside, take 1 more nitroglycerin and dial 911. You make take a total of 3 nitroglycerin in a 15 minute time frame.  If you need a refill on your cardiac medications before your next appointment, please call your pharmacy.   Lab work: None If you have labs (blood work) drawn today and your tests are completely normal, you will receive your results only by: Marland Kitchen MyChart Message (if you have MyChart) OR . A paper copy in the mail If you have any lab test that is abnormal or we need to change your treatment, we will call you to review the results.  Testing/Procedures: Your physician has requested that you have an echocardiogram. Echocardiography is a painless test that uses sound waves to create images of your heart. It provides your doctor with information about the size and shape of your heart and how well your heart's chambers and valves are working. This procedure takes approximately one hour. There are no restrictions for this procedure.  YOUR ECHO IS SCHEDULED FOR MAY 18TH AT 1:15PM  Your physician has requested that you have a lexiscan myoview. For further information please visit [email or web address] Please follow instruction sheet, as given.    [facility] [address] [place], [place] [address] Phone:  [phone]  Jun 17, 2018    Pravin Rummel DOB: 1960-06-21 MRN: [id number] [address] [place] [address]   Dear Mr. Gadsden, Caez are scheduled for a Myocardial Perfusion Imaging Study on: MAY 21,2020  At 7:45 .  Please arrive 15 minutes prior to your appointment time for registration and insurance purposes.  The test will take  approximately 3 to 4 hours to complete; you may bring reading material.  If someone comes with you to your appointment, they will need to remain in the main lobby due to limited space in the testing area. **If you are pregnant or breastfeeding, please notify the nuclear lab prior to your appointment**  How to prepare for your Myocardial Perfusion Test: . Do not eat or drink 3 hours prior to your test, except you may have water. . Do not consume products containing caffeine (regular or decaffeinated) 12 hours prior to your test. (ex: coffee, chocolate, sodas, tea). Do bring a list of your current medications with you.  If not listed below, you may take your medications as normal. . Do not take metoprolol (Lopressor, Toprol) for 24 hours prior to the test.DO NOT TAKE YOUR METFORMIN THE MORNING OF TEST. Bring the medication to your appointment as you may be required to take it once the test is complete. . Do not take Hyzaar or Diovan/HCTZ the day of the test. . Do wear comfortable clothes (no dresses or overalls) and walking shoes, tennis shoes preferred (No heels or open toe shoes are allowed). . Do NOT wear cologne, perfume, aftershave, or lotions (deodorant is allowed). . If these instructions are not followed, your test will have to be rescheduled.  Please report to [address] for your test.  If you have questions or concerns about your appointment, you can call the [facility] at [phone].  If you cannot keep your appointment, please provide 24  hours notification to the Nuclear Lab, to avoid a possible $50 charge to your account.  Follow-Up: At [facility], you and your health needs are our priority.  As part of our continuing mission to provide you with exceptional heart care, we have created designated Provider Care Teams.  These Care Teams include your primary Cardiologist (physician) and Advanced Practice Providers (APPs -  Physician Assistants  and Nurse Practitioners) who all work together to provide you with the care you need, when you need it. You will need a follow up appointment in 1 months.   Any Other Special Instructions Will Be Listed Below (If Applicable).   Echocardiogram An echocardiogram is a procedure that uses painless sound waves (ultrasound) to produce an image of the heart. Images from an echocardiogram can provide important information about:  Signs of coronary artery disease (CAD).  Aneurysm detection. An aneurysm is a weak or damaged part of an artery wall that bulges out from the normal force of blood pumping through the body.  Heart size and shape. Changes in the size or shape of the heart can be associated with certain conditions, including heart failure, aneurysm, and CAD.  Heart muscle function.  Heart valve function.  Signs of a past heart attack.  Fluid buildup around the heart.  Thickening of the heart muscle.  A tumor or infectious growth around the heart valves. Tell a health care provider about:  Any allergies you have.  All medicines you are taking, including vitamins, herbs, eye drops, creams, and over-the-counter medicines.  Any blood disorders you have.  Any surgeries you have had.  Any medical conditions you have.  Whether you are pregnant or may be pregnant. What are the risks? Generally, this is a safe procedure. However, problems may occur, including:  Allergic reaction to dye (contrast) that may be used during the procedure. What happens before the procedure? No specific preparation is needed. You may eat and drink normally. What happens during the procedure?   An IV tube may be inserted into one of your veins.  You may receive contrast through this tube. A contrast is an injection that improves the quality of the pictures from your heart.  A gel will be applied to your chest.  A wand-like tool (transducer) will be moved over your chest. The gel will help to  transmit the sound waves from the transducer.  The sound waves will harmlessly bounce off of your heart to allow the heart images to be captured in real-time motion. The images will be recorded on a computer. The procedure may vary among health care providers and hospitals. What happens after the procedure?  You may return to your normal, everyday life, including diet, activities, and medicines, unless your health care provider tells you not to do that. Summary  An echocardiogram is a procedure that uses painless sound waves (ultrasound) to produce an image of the heart.  Images from an echocardiogram can provide important information about the size and shape of your heart, heart muscle function, heart valve function, and fluid buildup around your heart.  You do not need to do anything to prepare before this procedure. You may eat and drink normally.  After the echocardiogram is completed, you may return to your normal, everyday life, unless your health care provider tells you not to do that. This information is not intended to replace advice given to you by your health care provider. Make sure you discuss any questions you have with your health care provider.  Document Released: 01/18/2000 Document Revised: 02/23/2016 Document Reviewed: 02/23/2016 Elsevier Interactive Patient Education  2019 Elsevier Inc.   Cardiac Nuclear Scan A cardiac nuclear scan is a test that is done to check the flow of blood to your heart. It is done when you are resting and when you are exercising. The test looks for problems such as:  Not enough blood reaching a portion of the heart.  The heart muscle not working as it should. You may need this test if:  You have heart disease.  You have had lab results that are not normal.  You have had heart surgery or a balloon procedure to open up blocked arteries (angioplasty).  You have chest pain.  You have shortness of breath. In this test, a special dye  (tracer) is put into your bloodstream. The tracer will travel to your heart. A camera will then take pictures of your heart to see how the tracer moves through your heart. This test is usually done at a hospital and takes 2-4 hours. Tell a doctor about:  Any allergies you have.  All medicines you are taking, including vitamins, herbs, eye drops, creams, and over-the-counter medicines.  Any problems you or family members have had with anesthetic medicines.  Any blood disorders you have.  Any surgeries you have had.  Any medical conditions you have.  Whether you are pregnant or may be pregnant. What are the risks? Generally, this is a safe test. However, problems may occur, such as:  Serious chest pain and heart attack. This is only a risk if the stress portion of the test is done.  Rapid heartbeat.  A feeling of warmth in your chest. This feeling usually does not last long.  Allergic reaction to the tracer. What happens before the test?  Ask your doctor about changing or stopping your normal medicines. This is important.  Follow instructions from your doctor about what you cannot eat or drink.  Remove your jewelry on the day of the test. What happens during the test?  An IV tube will be inserted into one of your veins.  Your doctor will give you a small amount of tracer through the IV tube.  You will wait for 20-40 minutes while the tracer moves through your bloodstream.  Your heart will be monitored with an electrocardiogram (ECG).  You will lie down on an exam table.  Pictures of your heart will be taken for about 15-20 minutes.  You may also have a stress test. For this test, one of these things may be done: ? You will be asked to exercise on a treadmill or a stationary bike. ? You will be given medicines that will make your heart work harder. This is done if you are unable to exercise.  When blood flow to your heart has peaked, a tracer will again be given  through the IV tube.  After 20-40 minutes, you will get back on the exam table. More pictures will be taken of your heart.  Depending on the tracer that is used, more pictures may need to be taken 3-4 hours later.  Your IV tube will be removed when the test is over. The test may vary among doctors and hospitals. What happens after the test?  Ask your doctor: ? Whether you can return to your normal schedule, including diet, activities, and medicines. ? Whether you should drink more fluids. This will help to remove the tracer from your body. Drink enough fluid to keep your pee (urine)  pale yellow.  Ask your doctor, or the department that is doing the test: ? When will my results be ready? ? How will I get my results? Summary  A cardiac nuclear scan is a test that is done to check the flow of blood to your heart.  Tell your doctor whether you are pregnant or may be pregnant.  Before the test, ask your doctor about changing or stopping your normal medicines. This is important.  Ask your doctor whether you can return to your normal activities. You may be asked to drink more fluids. This information is not intended to replace advice given to you by your health care provider. Make sure you discuss any questions you have with your health care provider. Document Released: 07/06/2017 Document Revised: 07/06/2017 Document Reviewed: 07/06/2017 Elsevier Interactive Patient Education  2019 [employer].

## 2018-06-17 NOTE — Addendum Note (Signed)
Addended by: Fayrene Fearing B on: 06/17/2018 10:00 AM   Modules accepted: Orders

## 2018-06-21 ENCOUNTER — Ambulatory Visit (INDEPENDENT_AMBULATORY_CARE_PROVIDER_SITE_OTHER): Payer: BLUE CROSS/BLUE SHIELD

## 2018-06-21 ENCOUNTER — Other Ambulatory Visit: Payer: Self-pay

## 2018-06-21 DIAGNOSIS — R0789 Other chest pain: Secondary | ICD-10-CM

## 2018-06-21 DIAGNOSIS — I10 Essential (primary) hypertension: Secondary | ICD-10-CM | POA: Diagnosis not present

## 2018-06-21 DIAGNOSIS — R079 Chest pain, unspecified: Secondary | ICD-10-CM | POA: Diagnosis not present

## 2018-06-21 DIAGNOSIS — I2510 Atherosclerotic heart disease of native coronary artery without angina pectoris: Secondary | ICD-10-CM | POA: Diagnosis not present

## 2018-06-21 NOTE — Progress Notes (Signed)
Complete echocardiogram has been performed.  Jimmy Kaelen Caughlin RDCS, RVT 

## 2018-06-22 ENCOUNTER — Telehealth: Payer: Self-pay | Admitting: *Deleted

## 2018-06-22 NOTE — Telephone Encounter (Signed)
Left message on voicemail in reference to upcoming appointment scheduled for 06/24/18. Phone number given for a call back so details instructions can be given.  Kyle Gray

## 2018-06-23 ENCOUNTER — Telehealth: Payer: Self-pay | Admitting: *Deleted

## 2018-06-23 NOTE — Telephone Encounter (Signed)
Patient given detailed instructions per Myocardial Perfusion Study Information Sheet for the test on 06/24/18. Patient notified to arrive 15 minutes early and that it is imperative to arrive on time for appointment to keep from having the test rescheduled.  If you need to cancel or reschedule your appointment, please call the office within 24 hours of your appointment. . Patient verbalized understanding. Kyle Gray Jacqueline    

## 2018-06-24 ENCOUNTER — Other Ambulatory Visit: Payer: Self-pay

## 2018-06-24 ENCOUNTER — Ambulatory Visit (INDEPENDENT_AMBULATORY_CARE_PROVIDER_SITE_OTHER): Payer: BLUE CROSS/BLUE SHIELD

## 2018-06-24 DIAGNOSIS — R079 Chest pain, unspecified: Secondary | ICD-10-CM

## 2018-06-24 LAB — MYOCARDIAL PERFUSION IMAGING
LV dias vol: 94 mL (ref 62–150)
LV sys vol: 36 mL
Peak HR: 96 {beats}/min
Rest HR: 67 {beats}/min
SDS: 0
SRS: 0
SSS: 0
TID: 1.16

## 2018-06-24 MED ORDER — REGADENOSON 0.4 MG/5ML IV SOLN
0.4000 mg | Freq: Once | INTRAVENOUS | Status: AC
  Administered 2018-06-24: 0.4 mg via INTRAVENOUS

## 2018-06-24 MED ORDER — TECHNETIUM TC 99M TETROFOSMIN IV KIT
8.6000 | PACK | Freq: Once | INTRAVENOUS | Status: AC | PRN
  Administered 2018-06-24: 8.6 via INTRAVENOUS

## 2018-06-24 MED ORDER — TECHNETIUM TC 99M TETROFOSMIN IV KIT
25.4000 | PACK | Freq: Once | INTRAVENOUS | Status: AC | PRN
  Administered 2018-06-24: 25.4 via INTRAVENOUS

## 2018-06-25 ENCOUNTER — Telehealth: Payer: Self-pay

## 2018-06-25 NOTE — Telephone Encounter (Signed)
Called and left detailed voice message on patients phone regarding test results. 

## 2018-06-25 NOTE — Telephone Encounter (Signed)
-----   Message from Garwin Brothers, MD sent at 06/21/2018  3:48 PM EDT ----- The results of the study is unremarkable. Please inform patient. I will discuss in detail at next appointment. Cc  primary care/referring physician Garwin Brothers, MD 06/21/2018 3:48 PM

## 2018-07-30 ENCOUNTER — Telehealth (INDEPENDENT_AMBULATORY_CARE_PROVIDER_SITE_OTHER): Payer: BC Managed Care – PPO | Admitting: Cardiology

## 2018-07-30 ENCOUNTER — Encounter: Payer: Self-pay | Admitting: Cardiology

## 2018-07-30 ENCOUNTER — Other Ambulatory Visit: Payer: Self-pay

## 2018-07-30 DIAGNOSIS — E119 Type 2 diabetes mellitus without complications: Secondary | ICD-10-CM | POA: Diagnosis not present

## 2018-07-30 DIAGNOSIS — E785 Hyperlipidemia, unspecified: Secondary | ICD-10-CM | POA: Diagnosis not present

## 2018-07-30 DIAGNOSIS — I10 Essential (primary) hypertension: Secondary | ICD-10-CM | POA: Diagnosis not present

## 2018-07-30 DIAGNOSIS — R0789 Other chest pain: Secondary | ICD-10-CM | POA: Diagnosis not present

## 2018-07-30 NOTE — Progress Notes (Signed)
Virtual Visit via Video Note   This visit type was conducted due to national recommendations for restrictions regarding the COVID-19 Pandemic (e.g. social distancing) in an effort to limit this patient's exposure and mitigate transmission in our community.  Due to his co-morbid illnesses, this patient is at least at moderate risk for complications without adequate follow up.  This format is felt to be most appropriate for this patient at this time.  All issues noted in this document were discussed and addressed.  A limited physical exam was performed with this format.  Please refer to the patient's chart for his consent to telehealth for [facility].   Date:  07/30/2018   ID:  Kyle Gray, DOB 03/30/1960, MRN [id number]  Patient Location: Home Provider Location: Office  PCP:  Maris Berger, MD  Cardiologist:  No primary care provider on file.  Electrophysiologist:  None   Evaluation Performed:  Follow-Up Visit  Chief Complaint: Chest discomfort  History of Present Illness:    Kyle Gray is a 58 y.o. male with past medical history of essential hypertension dyslipidemia and diabetes mellitus.  Patient mentions to me that he is active working all day long and with this he has no more symptoms.  I discussed stress testing echocardiogram report with him and is happy about it.  No orthopnea or PND.  With activities of daily living and even the physical activity through his day of work he has no symptoms of chest pain anymore.  At the time of my evaluation, the patient is alert awake oriented and in no distress.  The patient does not have symptoms concerning for COVID-19 infection (fever, chills, cough, or new shortness of breath).    Past Medical History:  Diagnosis Date  . Stroke [facility])    Past Surgical History:  Procedure Laterality Date  . CARDIAC SURGERY    . EYE SURGERY    . HERNIA REPAIR    . STENT PLACEMENT VASCULAR ([facility] HX)       Current Meds  Medication Sig   . amLODipine (NORVASC) 10 MG tablet Take 10 mg by mouth daily.  . ASPIRIN 81 PO Take 81 mg by mouth daily.   . cetirizine (ZYRTEC) 10 MG tablet Take 10 mg by mouth daily as needed.  . clopidogrel (PLAVIX) 75 MG tablet Take 75 mg by mouth daily.  Marland Kitchen losartan-hydrochlorothiazide (HYZAAR) 100-12.5 MG tablet Take 1 tablet by mouth daily.  . metFORMIN (GLUCOPHAGE) 500 MG tablet Take 500 mg by mouth 2 (two) times daily with a meal.   . metoprolol succinate (TOPROL-XL) 50 MG 24 hr tablet Take 50 mg by mouth daily.  . nitroGLYCERIN (NITROSTAT) 0.4 MG SL tablet Place 1 tablet (0.4 mg total) under the tongue every 5 (five) minutes as needed for chest pain.  . pantoprazole (PROTONIX) 40 MG tablet Take 1 tablet (40 mg total) by mouth daily.  Marland Kitchen REPATHA PUSHTRONEX SYSTEM 420 MG/3.5ML SOCT      Allergies:   Hydrocodone, Oxycodone, and Shellfish allergy   Social History   Tobacco Use  . Smoking status: Current Every Day Smoker  . Smokeless tobacco: Never Used  Substance Use Topics  . Alcohol use: Not on file  . Drug use: Not on file     Family Hx: The patient's family history includes Heart attack in his mother; Stroke in his father and maternal grandfather.  ROS:   Please see the history of present illness.    As mentioned above All other systems reviewed and  are negative.   Prior CV studies:   The following studies were reviewed today:  Kyle Gray GATED SPECT [facility] PERF [facility] STRESS 1D Order# [id number] Reading physician: Park Liter, MD Ordering physician: Jenean Lindau, MD Study date: 06/24/18  Patient Information  Name MRN Description  Kyle Gray [id number] 58 y.o. male  Result Notes for MYOCARDIAL PERFUSION IMAGING  Notes recorded by Tarri Glenn, [place] on 06/25/2018 at 3:51 PM EDT  Left detailed voice message on patient phone regarding test results.  ------   Notes recorded by Jenean Lindau, MD on 06/25/2018 at 8:44 AM EDT  The results of the study  is unremarkable. Please inform patient. I will discuss in detail at next appointment. Cc primary care/referring physician  Jenean Lindau, MD 06/25/2018 8:44 AM      Vitals  Height Weight BMI (Calculated)  5\' 8"  (1.727 m) 228 lb (103.4 kg) 34.68  Study Highlights   The left ventricular ejection fraction is normal (55-65%).  Nuclear stress EF: 62%.  Blood pressure demonstrated a normal response to exercise.  There was no ST segment deviation noted during stress.  This is a low risk study.  No ischemia or scar noted.       Labs/Other Tests and Data Reviewed:    EKG:  No ECG reviewed.  Recent Labs: No results found for requested labs within last 8760 hours.   Recent Lipid Panel No results found for: CHOL, TRIG, HDL, CHOLHDL, LDLCALC, LDLDIRECT  Wt Readings from Last 3 Encounters:  07/30/18 228 lb (103.4 kg)  06/24/18 231 lb (104.8 kg)  06/17/18 231 lb (104.8 kg)     Objective:    Vital Signs:  Ht 5\' 8"  (1.727 m)   Wt 228 lb (103.4 kg)   BMI 34.67 kg/m    VITAL SIGNS:  reviewed  ASSESSMENT & PLAN:    1. Chest discomfort: I discussed my findings with the patient at extensive length.  I am very happy that he has no more symptoms of this nature.  His stress test report was discussed with him and he is pleased.  He is walking some on a regular basis.  He tells me overall he feels very tired when he comes back from work because of the hot weather environment that he works in. Essential hypertension: Blood pressure stable Diabetes mellitus: Managed by primary care physician. Mixed dyslipidemia: Patient is on injectable lipid-lowering medications and he has been told by his doctor this his lipids are fine Patient will be seen in follow-up appointment in 4 months or earlier if the patient has any concerns  COVID-19 Education: The signs and symptoms of COVID-19 were discussed with the patient and how to seek care for testing (follow up with PCP or arrange E-visit).   The importance of social distancing was discussed today.  Time:   Today, I have spent 15 minutes with the patient with telehealth technology discussing the above problems.     Medication Adjustments/Labs and Tests Ordered: Current medicines are reviewed at length with the patient today.  Concerns regarding medicines are outlined above.   Tests Ordered: No orders of the defined types were placed in this encounter.   Medication Changes: No orders of the defined types were placed in this encounter.   Follow Up:  Virtual Visit or In Person in 4 month(s)  Signed, Jenean Lindau, MD  07/30/2018 4:37 PM    [place]

## 2018-07-30 NOTE — Patient Instructions (Signed)

## 2018-07-31 DIAGNOSIS — Z8673 Personal history of transient ischemic attack (TIA), and cerebral infarction without residual deficits: Secondary | ICD-10-CM

## 2018-07-31 DIAGNOSIS — R42 Dizziness and giddiness: Secondary | ICD-10-CM

## 2018-07-31 DIAGNOSIS — E1165 Type 2 diabetes mellitus with hyperglycemia: Secondary | ICD-10-CM | POA: Diagnosis not present

## 2018-07-31 DIAGNOSIS — E785 Hyperlipidemia, unspecified: Secondary | ICD-10-CM

## 2018-07-31 DIAGNOSIS — R079 Chest pain, unspecified: Secondary | ICD-10-CM

## 2018-07-31 DIAGNOSIS — I7090 Unspecified atherosclerosis: Secondary | ICD-10-CM

## 2018-07-31 DIAGNOSIS — I10 Essential (primary) hypertension: Secondary | ICD-10-CM | POA: Diagnosis not present

## 2018-07-31 DIAGNOSIS — E876 Hypokalemia: Secondary | ICD-10-CM

## 2018-08-01 DIAGNOSIS — R42 Dizziness and giddiness: Secondary | ICD-10-CM | POA: Diagnosis not present

## 2018-08-01 DIAGNOSIS — I10 Essential (primary) hypertension: Secondary | ICD-10-CM | POA: Diagnosis not present

## 2018-08-01 DIAGNOSIS — R079 Chest pain, unspecified: Secondary | ICD-10-CM | POA: Diagnosis not present

## 2018-08-01 DIAGNOSIS — E1165 Type 2 diabetes mellitus with hyperglycemia: Secondary | ICD-10-CM | POA: Diagnosis not present

## 2018-12-03 ENCOUNTER — Ambulatory Visit: Payer: BC Managed Care – PPO | Admitting: Cardiology

## 2019-06-30 ENCOUNTER — Telehealth (HOSPITAL_COMMUNITY): Payer: Self-pay | Admitting: Radiology

## 2019-06-30 ENCOUNTER — Other Ambulatory Visit (HOSPITAL_COMMUNITY): Payer: Self-pay | Admitting: Interventional Radiology

## 2019-06-30 DIAGNOSIS — I771 Stricture of artery: Secondary | ICD-10-CM

## 2019-06-30 NOTE — Telephone Encounter (Signed)
Called pt, left VM for him to call to schedule consult with Deveshwar. JM 

## 2019-07-15 ENCOUNTER — Ambulatory Visit (HOSPITAL_COMMUNITY)
Admission: RE | Admit: 2019-07-15 | Discharge: 2019-07-15 | Disposition: A | Discharge status: Home / Self Care | Payer: BC Managed Care – PPO | Source: Ambulatory Visit | Attending: Interventional Radiology | Admitting: Interventional Radiology

## 2019-07-15 ENCOUNTER — Other Ambulatory Visit: Payer: Self-pay

## 2019-07-15 DIAGNOSIS — I771 Stricture of artery: Secondary | ICD-10-CM

## 2019-07-15 NOTE — Consult Note (Signed)
Chief Complaint: Patient was seen in consultation today for right ICA cavernous aneurysm.  Referring Physician(s): Annamarie Major; Tamala Bari  Supervising Physician: Luanne Bras  Patient Status: [name]  History of Present Illness: Kyle Gray is a 59 y.o. male with a past medical history as below, with pertinent past medical history including hypertension, dyslipidemia, iliac artery aneurysm, CVA, diabetes mellitus, chronic low back pain, and tobacco abuse. He is known to [facility] and has been followed by Dr. Estanislado Pandy since 02/2009. He first presented to our department at the request of Dr. 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 He underwent an image-guided diagnostic cerebral arteriogram 02/23/2009 by Dr. Estanislado Pandy (results below). Following this, patient underwent an image-guided cerebral arteriogram with revascularization of right VA at origin using stent assisted angioplasty 07/31/2010. Follow-up plan included routine imaging scans to monitor for changes, however patient has unfortunately since been lost to follow-up. He has been followed by [facility] neurology in [place], [place] for management of chronic low back pain and hypertensive headaches. His recent follow-up imaging for these headaches revealed a right ICA cavernous protrusion suspicious for aneurysm.  Diagnostic cerebral arteriogram 02/23/2009: 1. Approximately 50% stenosis of the right vertebral artery just distal to its origin.  2. Approximately 70-74% stenosis of the right vertebrobasilar junction distal to the origin of the right posterior-inferior cerebellar artery.  3. 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  4. 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   NIR requested by Dr. Mikeal Hawthorne for management of a right ICA cavernous aneurysm. Patient awake and alert sitting in chair with no  complaints at this time. Denies headaches, weakness, numbness/tingling, dizziness, vision changes, hearing changes, tinnitus, or speech difficulty.  Currently taking Plavix 75 mg once daily and Aspirin 81 mg once daily.   Past Medical History:  Diagnosis Date   Stroke [facility])     Past Surgical History:  Procedure Laterality Date   CARDIAC SURGERY     EYE SURGERY     HERNIA REPAIR     STENT PLACEMENT VASCULAR ([facility] HX)      Allergies: Hydrocodone, Oxycodone, and Shellfish allergy  Medications: Prior to Admission medications   Medication Sig Start Date End Date Taking? Authorizing Provider  amLODipine (NORVASC) 10 MG tablet Take 10 mg by mouth daily. 04/16/18   [provider]  ASPIRIN 81 PO Take 81 mg by mouth daily.  09/26/15   [provider]  cetirizine (ZYRTEC) 10 MG tablet Take 10 mg by mouth daily as needed. 04/17/18   [provider]  clopidogrel (PLAVIX) 75 MG tablet Take 75 mg by mouth daily. 03/22/18   [provider]  losartan-hydrochlorothiazide (HYZAAR) 100-12.5 MG tablet Take 1 tablet by mouth daily. 06/15/18   [provider]  metFORMIN (GLUCOPHAGE) 500 MG tablet Take 500 mg by mouth 2 (two) times daily with a meal.  06/03/15   [provider]  metoprolol succinate (TOPROL-XL) 50 MG 24 hr tablet Take 50 mg by mouth daily. 05/30/18   [provider]  nitroGLYCERIN (NITROSTAT) 0.4 MG SL tablet Place 1 tablet (0.4 mg total) under the tongue every 5 (five) minutes as needed for chest pain. 06/17/18 09/15/18  Revankar, Reita Cliche, MD  pantoprazole (PROTONIX) 40 MG tablet Take 1 tablet (40 mg total) by mouth daily. 06/01/17   Jackquline Denmark, [place] 420 MG/3.5ML SOCT  01/01/18   [provider]     Family History  Problem Relation Age of Onset   Heart attack Mother    Stroke Father    Stroke Maternal Grandfather     Social History   Socioeconomic History   Marital status:  Married    Spouse name: Not on file   Number of children: Not on file   Years of education: Not on file   Highest education level: Not on file  Occupational History   Not on file  Tobacco Use   Smoking status: Current Every Day Smoker   Smokeless tobacco: Never Used  Substance and Sexual Activity   Alcohol use: Not on file   Drug use: Not on file   Sexual activity: Not on file  Other Topics Concern   Not on file  Social History Narrative   Not on file   Social Determinants of Health   Financial Resource Strain:    Difficulty of Paying Living Expenses:   Food Insecurity:    Worried About Radiation protection practitioner of Food in the Last Year:    Barista in the Last Year:   Transportation Needs:    Freight forwarder (Medical):    Lack of Transportation (Non-Medical):   Physical Activity:    Days of Exercise per Week:    Minutes of Exercise per Session:   Stress:    Feeling of Stress :   Social Connections:    Frequency of Communication with Friends and Family:    Frequency of Social Gatherings with Friends and Family:    Attends Religious Services:    Active Member of Clubs or Organizations:    Attends Banker Meetings:    Marital Status:      Review of Systems: A 12 point ROS discussed and pertinent positives are indicated in the HPI above.  All other systems are negative.  Review of Systems  Constitutional: Negative for fever.  HENT: Negative for hearing loss and tinnitus.   Eyes: Negative for visual disturbance.  Respiratory: Negative for shortness of breath and wheezing.   Cardiovascular: Negative for chest pain and palpitations.  Neurological: Negative for dizziness, speech difficulty, weakness, numbness and headaches.  Psychiatric/Behavioral: Negative for behavioral problems and confusion.    Vital Signs: There were no vitals taken for this visit.  Physical Exam Constitutional:      General: He is not in acute  distress.    Appearance: Normal appearance.  Pulmonary:     Effort: Pulmonary effort is normal. No respiratory distress.  Skin:    General: Skin is warm and dry.  Neurological:     Mental Status: He is alert and oriented to person, place, and time.      Imaging: No results found.  Labs:  CBC: No results for input(s): WBC, HGB, HCT, PLT in the last 8760 hours.  COAGS: No results for input(s): INR, APTT in the last 8760 hours.  BMP: No results for input(s): NA, K, CL, CO2, GLUCOSE, BUN, CALCIUM, CREATININE, GFRNONAA, GFRAA in the last 8760 hours.  Invalid input(s): CMP  LIVER FUNCTION TESTS: No results for input(s): BILITOT, AST, ALT, ALKPHOS, PROT, ALBUMIN in the last 8760 hours.  TUMOR MARKERS: No results for input(s): AFPTM, CEA, CA199, CHROMGRNA in the last 8760 hours.  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. Right ICA cavernous protrusion, suspicious for aneurysm, seen on MRA head 02/19/2019 during work-up for headache. [name]  was present for consultation.  Discussed findings of recent MRA head, specifically right ICA cavernous protrusion, suspicious for aneurysm. Recommended diagnostic cerebral arteriogram to confirm aneurysm and plan possible treatment. Discussed procedure, including risks and benefits. Explained we will also evaluate his right VA stent, along with all other vessels in head/neck during this examination. Patient expresses desire to move forward with procedure. Plan for follow-up with an image-guided diagnostic cerebral arteriogram, first available- patient requests scheduling 2 months out, and not to be called next week to schedule due to his brother's funeral being next week. Informed patient that our schedulers will call him to set up this procedure.  Discussed secondary stroke prevention. Instructed patient to maintain control of hypertension and  diabetes mellitus. Counseled patient on smoking cessation. Instructed patient to continue taking Plavix 75 mg once daily and Aspirin 81 mg once daily.  All questions answered and concerns addressed. Patient conveys understanding and agrees with plan.  Thank you for this interesting consult.  I greatly enjoyed meeting Kyle Gray and look forward to participating in their care.  A copy of this report was sent to the requesting provider on this date.  Electronically Signed: Elwin Mocha, PA-C 07/15/2019, 11:17 AM   I spent a total of 30 Minutes in face to face in clinical consultation, greater than 50% of which was counseling/coordinating care for right ICA cavernous aneurysm.

## 2019-07-26 ENCOUNTER — Other Ambulatory Visit (HOSPITAL_COMMUNITY): Payer: Self-pay | Admitting: Interventional Radiology

## 2019-07-26 DIAGNOSIS — I771 Stricture of artery: Secondary | ICD-10-CM

## 2019-08-04 ENCOUNTER — Telehealth (HOSPITAL_COMMUNITY): Payer: Self-pay

## 2019-08-04 NOTE — Telephone Encounter (Signed)
Called to reschedule angiogram, no answer, left vm. AW  

## 2019-08-12 ENCOUNTER — Ambulatory Visit (HOSPITAL_COMMUNITY): Admission: RE | Admit: 2019-08-12 | Payer: BC Managed Care – PPO | Source: Ambulatory Visit

## 2019-08-17 ENCOUNTER — Other Ambulatory Visit: Payer: Self-pay | Admitting: Radiology

## 2019-08-18 ENCOUNTER — Other Ambulatory Visit: Payer: Self-pay | Admitting: Student

## 2019-08-18 ENCOUNTER — Other Ambulatory Visit: Payer: Self-pay | Admitting: Radiology

## 2019-08-19 ENCOUNTER — Encounter (HOSPITAL_COMMUNITY): Payer: Self-pay

## 2019-08-19 ENCOUNTER — Other Ambulatory Visit: Payer: Self-pay

## 2019-08-19 ENCOUNTER — Other Ambulatory Visit (HOSPITAL_COMMUNITY): Payer: Self-pay | Admitting: Interventional Radiology

## 2019-08-19 ENCOUNTER — Ambulatory Visit (HOSPITAL_COMMUNITY)
Admission: RE | Admit: 2019-08-19 | Discharge: 2019-08-19 | Disposition: A | Discharge status: Home / Self Care | Payer: BC Managed Care – PPO | Source: Ambulatory Visit | Attending: Interventional Radiology | Admitting: Interventional Radiology

## 2019-08-19 DIAGNOSIS — I10 Essential (primary) hypertension: Secondary | ICD-10-CM | POA: Diagnosis not present

## 2019-08-19 DIAGNOSIS — Z7984 Long term (current) use of oral hypoglycemic drugs: Secondary | ICD-10-CM | POA: Diagnosis not present

## 2019-08-19 DIAGNOSIS — Z7902 Long term (current) use of antithrombotics/antiplatelets: Secondary | ICD-10-CM | POA: Diagnosis not present

## 2019-08-19 DIAGNOSIS — F17200 Nicotine dependence, unspecified, uncomplicated: Secondary | ICD-10-CM | POA: Insufficient documentation

## 2019-08-19 DIAGNOSIS — Z885 Allergy status to narcotic agent status: Secondary | ICD-10-CM | POA: Insufficient documentation

## 2019-08-19 DIAGNOSIS — I723 Aneurysm of iliac artery: Secondary | ICD-10-CM | POA: Diagnosis not present

## 2019-08-19 DIAGNOSIS — Z79899 Other long term (current) drug therapy: Secondary | ICD-10-CM | POA: Diagnosis not present

## 2019-08-19 DIAGNOSIS — Z8673 Personal history of transient ischemic attack (TIA), and cerebral infarction without residual deficits: Secondary | ICD-10-CM | POA: Insufficient documentation

## 2019-08-19 DIAGNOSIS — E785 Hyperlipidemia, unspecified: Secondary | ICD-10-CM | POA: Diagnosis not present

## 2019-08-19 DIAGNOSIS — Z7982 Long term (current) use of aspirin: Secondary | ICD-10-CM | POA: Insufficient documentation

## 2019-08-19 DIAGNOSIS — I771 Stricture of artery: Secondary | ICD-10-CM

## 2019-08-19 DIAGNOSIS — G450 Vertebro-basilar artery syndrome: Secondary | ICD-10-CM | POA: Diagnosis present

## 2019-08-19 DIAGNOSIS — E119 Type 2 diabetes mellitus without complications: Secondary | ICD-10-CM | POA: Diagnosis not present

## 2019-08-19 HISTORY — PX: IR ANGIO VERTEBRAL SEL VERTEBRAL UNI R MOD SED: IMG5368

## 2019-08-19 HISTORY — PX: IR ANGIO INTRA EXTRACRAN SEL COM CAROTID INNOMINATE BILAT MOD SED: IMG5360

## 2019-08-19 HISTORY — PX: IR ANGIO VERTEBRAL SEL SUBCLAVIAN INNOMINATE UNI L MOD SED: IMG5364

## 2019-08-19 LAB — CBC
HCT: 46.6 % (ref 39.0–52.0)
Hemoglobin: 14.8 g/dL (ref 13.0–17.0)
MCH: 28.8 pg (ref 26.0–34.0)
MCHC: 31.8 g/dL (ref 30.0–36.0)
MCV: 90.7 fL (ref 80.0–100.0)
Platelets: 164 10*3/uL (ref 150–400)
RBC: 5.14 MIL/uL (ref 4.22–5.81)
RDW: 12.6 % (ref 11.5–15.5)
WBC: 6.2 10*3/uL (ref 4.0–10.5)
nRBC: 0 % (ref 0.0–0.2)

## 2019-08-19 LAB — BASIC METABOLIC PANEL
Anion gap: 11 (ref 5–15)
BUN: 9 mg/dL (ref 6–20)
CO2: 25 mmol/L (ref 22–32)
Calcium: 9.3 mg/dL (ref 8.9–10.3)
Chloride: 106 mmol/L (ref 98–111)
Creatinine, Ser: 0.99 mg/dL (ref 0.61–1.24)
GFR calc Af Amer: >60 mL/min (ref >60)
GFR calc non Af Amer: >60 mL/min (ref >60)
Glucose, Bld: 127 mg/dL — ABNORMAL HIGH (ref 70–99)
Potassium: 3.4 mmol/L — ABNORMAL LOW (ref 3.5–5.1)
Sodium: 142 mmol/L (ref 135–145)

## 2019-08-19 LAB — PROTIME-INR
INR: 1 (ref 0.8–1.2)
Prothrombin Time: 12.8 seconds (ref 11.4–15.2)

## 2019-08-19 MED ORDER — MIDAZOLAM HCL 2 MG/2ML IJ SOLN
INTRAMUSCULAR | Status: AC
  Filled 2019-08-19: qty 2

## 2019-08-19 MED ORDER — SODIUM CHLORIDE 0.9 % IV SOLN: Freq: Once | INTRAVENOUS | Status: DC

## 2019-08-19 MED ORDER — IOHEXOL 300 MG/ML  SOLN
150.0000 mL | Freq: Once | INTRAMUSCULAR | Status: AC | PRN
  Administered 2019-08-19: 74 mL via INTRA_ARTERIAL

## 2019-08-19 MED ORDER — FENTANYL CITRATE (PF) 100 MCG/2ML IJ SOLN
INTRAMUSCULAR | Status: AC
  Filled 2019-08-19: qty 2

## 2019-08-19 MED ORDER — SODIUM CHLORIDE 0.9 % IV SOLN: INTRAVENOUS | Status: AC

## 2019-08-19 MED ORDER — HEPARIN SODIUM (PORCINE) 1000 UNIT/ML IJ SOLN
INTRAMUSCULAR | Status: AC
  Filled 2019-08-19: qty 2

## 2019-08-19 MED ORDER — SODIUM CHLORIDE 0.9 % IV SOLN
INTRAVENOUS | Status: AC | PRN
  Administered 2019-08-19: 75 mL/h via INTRAVENOUS
  Administered 2019-08-19: 250 mL via INTRAVENOUS

## 2019-08-19 MED ORDER — LIDOCAINE HCL 1 % IJ SOLN
INTRAMUSCULAR | Status: AC
  Filled 2019-08-19: qty 20

## 2019-08-19 MED ORDER — MIDAZOLAM HCL 2 MG/2ML IJ SOLN
INTRAMUSCULAR | Status: AC | PRN
  Administered 2019-08-19: 1 mg via INTRAVENOUS

## 2019-08-19 MED ORDER — FENTANYL CITRATE (PF) 100 MCG/2ML IJ SOLN
INTRAMUSCULAR | Status: AC | PRN
  Administered 2019-08-19: 25 ug via INTRAVENOUS

## 2019-08-19 MED ORDER — LIDOCAINE HCL (PF) 1 % IJ SOLN
INTRAMUSCULAR | Status: AC | PRN
  Administered 2019-08-19: 20 mL
  Administered 2019-08-19: 10 mL

## 2019-08-19 MED ORDER — HEPARIN SODIUM (PORCINE) 1000 UNIT/ML IJ SOLN
INTRAMUSCULAR | Status: AC | PRN
  Administered 2019-08-19: 1000 [IU] via INTRAVENOUS

## 2019-08-19 NOTE — Progress Notes (Signed)
Ambulated in hallway tol well no bleeding noted before or after ambulation  

## 2019-08-19 NOTE — Sedation Documentation (Signed)
5 Fr. Exoseal to right groin 

## 2019-08-19 NOTE — Progress Notes (Signed)
Discharge instructions reviewed with pt and his wife both voice understanding.  

## 2019-08-19 NOTE — H&P (Signed)
Chief Complaint: Patient was seen in consultation today for intra-cranial aneurysm  Supervising Physician: Luanne Bras  Patient Status: [facility] - Out-pt  History of Present Illness: Kyle Gray is a 59 y.o. male with history of hypertension, dyslipidemia, iliac artery aneurysm, CVA, diabetes mellitus, chronic low back pain, and tobacco abuse. He is known to NIR from prior stenting of the right proximal right vertebral artery due to stenosis in June 2012.  He has remained on aspirin and Plavix since this time and has been following with [facility].  He was recently seen in consultation with Dr. Estanislado Pandy 07/15/19 due to the development of a right ICA cavernous protrusion suspicious for aneurysm. He returns to [facility] today for diagnostic angiogram.   Patient assessed in Radiology this AM.  He is familiar with angiogram process and procedure from several years ago. Hopeful for radial access.  He has been NPO.    Past Medical History:  Diagnosis Date  . Stroke [facility])     Past Surgical History:  Procedure Laterality Date  . CARDIAC SURGERY    . EYE SURGERY    . HERNIA REPAIR    . STENT PLACEMENT VASCULAR ([facility] HX)      Allergies: Hydrocodone, Oxycodone, and Shellfish allergy  Medications: Prior to Admission medications   Medication Sig Start Date End Date Taking? Authorizing Provider  amLODipine (NORVASC) 10 MG tablet Take 10 mg by mouth daily. 04/16/18  Yes [provider]  ASPIRIN 81 PO Take 81 mg by mouth daily.  09/26/15  Yes [provider]  cetirizine (ZYRTEC) 10 MG tablet Take 10 mg by mouth daily as needed. 04/17/18  Yes [provider]  clopidogrel (PLAVIX) 75 MG tablet Take 75 mg by mouth daily. 03/22/18  Yes [provider]  losartan-hydrochlorothiazide (HYZAAR) 100-12.5 MG tablet Take 1 tablet by mouth daily. 06/15/18  Yes [provider]  metFORMIN (GLUCOPHAGE) 500 MG tablet Take 500 mg by mouth 2 (two) times daily  with a meal.  06/03/15  Yes [provider]  metoprolol succinate (TOPROL-XL) 50 MG 24 hr tablet Take 50 mg by mouth daily. 05/30/18  Yes [provider]  pantoprazole (PROTONIX) 40 MG tablet Take 1 tablet (40 mg total) by mouth daily. 06/01/17  Yes Jackquline Denmark, MD  sitaGLIPtin (JANUVIA) 25 MG tablet Take 25 mg by mouth daily.   Yes [provider]  nitroGLYCERIN (NITROSTAT) 0.4 MG SL tablet Place 1 tablet (0.4 mg total) under the tongue every 5 (five) minutes as needed for chest pain. 06/17/18 09/15/18  Revankar, Reita Cliche, MD  [place] 420 MG/3.5ML SOCT  01/01/18   [provider]     Family History  Problem Relation Age of Onset  . Heart attack Mother   . Stroke Father   . Stroke Maternal Grandfather     Social History   Socioeconomic History  . Marital status: Married    Spouse name: Not on file  . Number of children: Not on file  . Years of education: Not on file  . Highest education level: Not on file  Occupational History  . Not on file  Tobacco Use  . Smoking status: Current Every Day Smoker  . Smokeless tobacco: Never Used  Vaping Use  . Vaping Use: Never used  Substance and Sexual Activity  . Alcohol use: Not Currently  . Drug use: Never  . Sexual activity: Not on file  Other Topics Concern  . Not on file  Social History Narrative  .  Not on file   Social Determinants of Health   Financial Resource Strain:   . Difficulty of Paying Living Expenses:   Food Insecurity:   . Worried About Charity fundraiser in the Last Year:   . Arboriculturist in the Last Year:   Transportation Needs:   . Film/video editor (Medical):   Marland Kitchen Lack of Transportation (Non-Medical):   Physical Activity:   . Days of Exercise per Week:   . Minutes of Exercise per Session:   Stress:   . Feeling of Stress :   Social Connections:   . Frequency of Communication with Friends and Family:   . Frequency of Social Gatherings with Friends  and Family:   . Attends Religious Services:   . Active Member of Clubs or Organizations:   . Attends Archivist Meetings:   Marland Kitchen Marital Status:      Review of Systems: A 12 point ROS discussed and pertinent positives are indicated in the HPI above.  All other systems are negative.  Review of Systems  Constitutional: Negative for fatigue and fever.  Respiratory: Negative for cough and shortness of breath.   Cardiovascular: Negative for chest pain.  Gastrointestinal: Negative for abdominal pain.  Musculoskeletal: Negative for back pain.  Psychiatric/Behavioral: Negative for behavioral problems and confusion.    Vital Signs: BP (!) 143/86   Pulse 74   Temp 98.1 F (36.7 C) (Oral)   Resp 16   Ht 5' 8"  (1.727 m)   Wt 232 lb (105.2 kg)   SpO2 98%   BMI 35.28 kg/m   Physical Exam Vitals and nursing note reviewed.  Constitutional:      General: He is not in acute distress.    Appearance: He is normal weight. He is not ill-appearing.  HENT:     Mouth/Throat:     Mouth: Mucous membranes are moist.     Pharynx: Oropharynx is clear.  Cardiovascular:     Rate and Rhythm: Normal rate and regular rhythm.  Pulmonary:     Effort: Pulmonary effort is normal. No respiratory distress.     Breath sounds: Normal breath sounds.  Abdominal:     General: Abdomen is flat.     Palpations: Abdomen is soft.  Skin:    General: Skin is warm and dry.  Neurological:     General: No focal deficit present.     Mental Status: He is alert and oriented to person, place, and time. Mental status is at baseline.  Psychiatric:        Mood and Affect: Mood normal.        Behavior: Behavior normal.        Thought Content: Thought content normal.        Judgment: Judgment normal.      MD Evaluation Airway: WNL Heart: WNL Abdomen: WNL Chest/ Lungs: WNL ASA  Classification: 3 Mallampati/Airway Score: Two   Imaging: No results found.  Labs:  CBC: Recent Labs    08/19/19 0625    WBC 6.2  HGB 14.8  HCT 46.6  PLT 164    COAGS: Recent Labs    08/19/19 0625  INR 1.0    BMP: Recent Labs    08/19/19 0625  NA 142  K 3.4*  CL 106  CO2 25  GLUCOSE 127*  BUN 9  CALCIUM 9.3  CREATININE 0.99  GFRNONAA >60  GFRAA >60    LIVER FUNCTION TESTS: No results for input(s): BILITOT, AST, ALT, ALKPHOS,  PROT, ALBUMIN in the last 8760 hours.  TUMOR MARKERS: No results for input(s): AFPTM, CEA, CA199, CHROMGRNA in the last 8760 hours.  Assessment and Plan: Patient with past medical history of vertebral artery stenosis s/p stenting in 2012 presents with concern for intra-cranial aneurysm.   Case reviewed by Dr. Estanislado Pandy who has met with the patient in consultation and approves patient for procedure.  Patient presents today in their usual state of health.  He has been NPO and is not currently on blood thinners.    Risks and benefits were discussed with the patient including, but not limited to bleeding, infection, vascular injury or contrast induced renal failure.  This interventional procedure involves the use of X-rays and because of the nature of the planned procedure, it is possible that we will have prolonged use of X-ray fluoroscopy.  Potential radiation risks to you include (but are not limited to) the following: - A slightly elevated risk for cancer  several years later in life. This risk is typically less than 0.5% percent. This risk is low in comparison to the normal incidence of human cancer, which is 33% for women and 50% for men according to the [place]. - Radiation induced injury can include skin redness, resembling a rash, tissue breakdown / ulcers and hair loss (which can be temporary or permanent).   The likelihood of either of these occurring depends on the difficulty of the procedure and whether you are sensitive to radiation due to previous procedures, disease, or genetic conditions.   IF your procedure requires a prolonged  use of radiation, you will be notified and given written instructions for further action.  It is your responsibility to monitor the irradiated area for the 2 weeks following the procedure and to notify your physician if you are concerned that you have suffered a radiation induced injury.    All of the patient's questions were answered, patient is agreeable to proceed.  Consent signed and in chart.  Thank you for this interesting consult.  I greatly enjoyed meeting Kyle Gray and look forward to participating in their care.  A copy of this report was sent to the requesting provider on this date.  Electronically Signed: Docia Barrier, PA 08/19/2019, 8:21 AM   I spent a total of    25 Minutes in face to face in clinical consultation, greater than 50% of which was counseling/coordinating care for intra-cranial aneurysm.

## 2019-08-19 NOTE — Progress Notes (Signed)
NIR.  Patient underwent an image-guided diagnostic cerebral arteriogram this AM with [name]. Return to work note (beginning Monday 08/22/2019, no restrictions) given to patient.  Please call NIR with questions/concerns.   Waylan Boga Helem Reesor, PA-C 08/19/2019, 9:51 AM

## 2019-08-19 NOTE — Procedures (Signed)
S/P bilateral common carotid ,RT vert artery and Lt subclavian arteriograms. RT CFA approach. Findings. 1. 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 S.Eulogio Requena MD

## 2019-08-19 NOTE — Discharge Instructions (Addendum)
Femoral Site Care This sheet gives you information about how to care for yourself after your procedure. Your health care provider may also give you more specific instructions. If you have problems or questions, contact your health care provider. What can I expect after the procedure? After the procedure, it is common to have:  Bruising that usually fades within 1-2 weeks.  Tenderness at the site. Follow these instructions at home: Wound care  Follow instructions from your health care provider about how to take care of your insertion site. Make sure you: ? Wash your hands with soap and water before you change your bandage (dressing). If soap and water are not available, use hand sanitizer. ? Change your dressing as told by your health care provider. ? Leave stitches (sutures), skin glue, or adhesive strips in place. These skin closures may need to stay in place for 2 weeks or longer. If adhesive strip edges start to loosen and curl up, you may trim the loose edges. Do not remove adhesive strips completely unless your health care provider tells you to do that.  Do not take baths, swim, or use a hot tub until your health care provider approves.  You may shower 24-48 hours after the procedure or as told by your health care provider. ? Gently wash the site with plain soap and water. ? Pat the area dry with a clean towel. ? Do not rub the site. This may cause bleeding.  Do not apply powder or lotion to the site. Keep the site clean and dry.  Check your femoral site every day for signs of infection. Check for: ? Redness, swelling, or pain. ? Fluid or blood. ? Warmth. ? Pus or a bad smell. Activity  For the first 2-3 days after your procedure, or as long as directed: ? Avoid climbing stairs as much as possible. ? Do not squat.  Do not lift anything that is heavier than 10 lb (4.5 kg), or the limit that you are told, until your health care provider says that it is safe.  Rest as  directed. ? Avoid sitting for a long time without moving. Get up to take short walks every 1-2 hours.  Do not drive for 24 hours if you were given a medicine to help you relax (sedative). General instructions  Take over-the-counter and prescription medicines only as told by your health care provider.  Keep all follow-up visits as told by your health care provider. This is important. Contact a health care provider if you have:  A fever or chills.  You have redness, swelling, or pain around your insertion site. Get help right away if:  The catheter insertion area swells very fast.  You pass out.  You suddenly start to sweat or your skin gets clammy.  The catheter insertion area is bleeding, and the bleeding does not stop when you hold steady pressure on the area.  The area near or just beyond the catheter insertion site becomes pale, cool, tingly, or numb. These symptoms may represent a serious problem that is an emergency. Do not wait to see if the symptoms will go away. Get medical help right away. Call your local emergency services (911 in the [place] Do not drive yourself to the hospital. Summary  After the procedure, it is common to have bruising that usually fades within 1-2 weeks.  Check your femoral site every day for signs of infection.  Do not lift anything that is heavier than 10 lb (4.5 kg), or the   limit that you are told, until your health care provider says that it is safe. This information is not intended to replace advice given to you by your health care provider. Make sure you discuss any questions you have with your health care provider. Document Revised: 02/02/2017 Document Reviewed: 02/02/2017 Elsevier Patient Education  2020 [employer]  Cerebral Angiogram, Care After This sheet gives you information about how to care for yourself after your procedure. Your health care provider may also give you more specific instructions. If you have problems or questions,  contact your health care provider. What can I expect after the procedure? After the procedure, it is common to have:  Bruising and tenderness at the catheter insertion site.  A mild headache. Follow these instructions at home: Insertion site care  Follow instructions from your health care provider about how to take care of the insertion site. Make sure you: ? Wash your hands with soap and water before and after you change your bandage (dressing). If soap and water are not available, use hand sanitizer. ? Change your dressing as told by your health care provider.  Do not take baths, swim, or use a hot tub until your health care provider approves. You may shower 24-48 hours after the procedure, or as told by your health care provider.  To clean your insertion site: ? Gently wash the site with plain soap and water. ? Pat the area dry with a clean towel. ? Do not rub the site. This may cause bleeding.  Do not apply powder or lotion to the site. Keep the site clean and dry. Infection signs Check your incision area every day for signs of infection. Check for:  Redness, swelling, or pain.  Fluid or blood.  Warmth.  Pus or a bad smell.  Activity  Do not drive for 24 hours if you were given a sedative during your procedure.  Rest as told by your health care provider.  Do not lift anything that is heavier than 10 lb (4.5 kg), or the limit that you are told, until your health care provider says that it is safe.  Return to your normal activities as told by your health care provider, usually in about a week. Ask your health care provider what activities are safe for you. General instructions   If your insertion site starts to bleed, lie flat and put pressure on the site. If the bleeding does not stop, get help right away. This is a medical emergency.  Do not use any products that contain nicotine or tobacco, such as cigarettes, e-cigarettes, and chewing tobacco. If you need help  quitting, ask your health care provider.  Take over-the-counter and prescription medicines only as told by your health care provider.  Drink enough fluid to keep your urine pale yellow. This helps flush the contrast dye from your body.  Keep all follow-up visits as directed by your health care provider. This is important. Contact a health care provider if:  You have a fever or chills.  You have redness, swelling, or pain around your insertion site.  You have fluid or blood coming from your insertion site.  The insertion site feels warm to the touch.  You have pus or a bad smell coming from your insertion site.  You notice blood collecting in the tissue around the insertion site (hematoma). The hematoma may be painful to the touch. Get help right away if:  You have chest pain or trouble breathing.  You have severe pain  or swelling at the insertion site.  The insertion area bleeds, and bleeding continues after 30 minutes of holding steady pressure on the site.  The arm or leg where the catheter was inserted is pale, cold, numb, tingling, or weak.  You have a rash.  You have any symptoms of a stroke. "BE FAST" is an easy way to remember the main warning signs of a stroke: ? B - Balance. Signs are dizziness, sudden trouble walking, or loss of balance. ? E - Eyes. Signs are trouble seeing or a sudden change in vision. ? F - Face. Signs are sudden weakness or numbness of the face, or the face or eyelid drooping on one side. ? A - Arms. Signs are weakness or numbness in an arm. This happens suddenly and usually on one side of the body. ? S - Speech. Signs are sudden trouble speaking, slurred speech, or trouble understanding what people say. ? T - Time. Time to call emergency services. Write down what time symptoms started.  You have other signs of a stroke, such as: ? A sudden, severe headache with no known cause. ? Nausea or vomiting. ? Seizure. These symptoms may represent a  serious problem that is an emergency. Do not wait to see if the symptoms will go away. Get medical help right away. Call your local emergency services (911 in the [place] Do not drive yourself to the hospital. Summary  Bruising and tenderness at the insertion site are common.  Follow your health care provider's instructions about caring for your insertion site. Change dressing and clean the area as instructed.  If your insertion site bleeds, apply direct pressure until bleeding stops.  Return to your normal activities as told by your health care provider. Ask what activities are safe.  Rest and drink plenty of fluids. This information is not intended to replace advice given to you by your health care provider. Make sure you discuss any questions you have with your health care provider. Document Revised: 08/10/2018 Document Reviewed: 08/10/2018 Elsevier Patient Education  2020 Elsevier Inc. Moderate Conscious Sedation, Adult Sedation is the use of medicines to promote relaxation and relieve discomfort and anxiety. Moderate conscious sedation is a type of sedation. Under moderate conscious sedation, you are less alert than normal, but you are still able to respond to instructions, touch, or both. Moderate conscious sedation is used during short medical and dental procedures. It is milder than deep sedation, which is a type of sedation under which you cannot be easily woken up. It is also milder than general anesthesia, which is the use of medicines to make you unconscious. Moderate conscious sedation allows you to return to your regular activities sooner. Tell a health care provider about:  Any allergies you have.  All medicines you are taking, including vitamins, herbs, eye drops, creams, and over-the-counter medicines.  Use of steroids (by mouth or creams).  Any problems you or family members have had with sedatives and anesthetic medicines.  Any blood disorders you have.  Any surgeries  you have had.  Any medical conditions you have, such as sleep apnea.  Whether you are pregnant or may be pregnant.  Any use of cigarettes, alcohol, marijuana, or street drugs. What are the risks? Generally, this is a safe procedure. However, problems may occur, including:  Getting too much medicine (oversedation).  Nausea.  Allergic reaction to medicines.  Trouble breathing. If this happens, a breathing tube may be used to help with breathing. It will be removed   when you are awake and breathing on your own.  Heart trouble.  Lung trouble. What happens before the procedure? Staying hydrated Follow instructions from your health care provider about hydration, which may include:  Up to 2 hours before the procedure - you may continue to drink clear liquids, such as water, clear fruit juice, black coffee, and plain tea. Eating and drinking restrictions Follow instructions from your health care provider about eating and drinking, which may include:  8 hours before the procedure - stop eating heavy meals or foods such as meat, fried foods, or fatty foods.  6 hours before the procedure - stop eating light meals or foods, such as toast or cereal.  6 hours before the procedure - stop drinking milk or drinks that contain milk.  2 hours before the procedure - stop drinking clear liquids. Medicine Ask your health care provider about:  Changing or stopping your regular medicines. This is especially important if you are taking diabetes medicines or blood thinners.  Taking medicines such as aspirin and ibuprofen. These medicines can thin your blood. Do not take these medicines before your procedure if your health care provider instructs you not to.  Tests and exams  You will have a physical exam.  You may have blood tests done to show: ? How well your kidneys and liver are working. ? How well your blood can clot. General instructions  Plan to have someone take you home from the  hospital or clinic.  If you will be going home right after the procedure, plan to have someone with you for 24 hours. What happens during the procedure?  An IV tube will be inserted into one of your veins.  Medicine to help you relax (sedative) will be given through the IV tube.  The medical or dental procedure will be performed. What happens after the procedure?  Your blood pressure, heart rate, breathing rate, and blood oxygen level will be monitored often until the medicines you were given have worn off.  Do not drive for 24 hours. This information is not intended to replace advice given to you by your health care provider. Make sure you discuss any questions you have with your health care provider. Document Revised: 01/02/2017 Document Reviewed: 05/12/2015 Elsevier Patient Education  2020 [employer].

## 2019-08-25 DIAGNOSIS — I7090 Unspecified atherosclerosis: Secondary | ICD-10-CM

## 2019-08-25 DIAGNOSIS — Z8673 Personal history of transient ischemic attack (TIA), and cerebral infarction without residual deficits: Secondary | ICD-10-CM

## 2019-08-25 HISTORY — DX: Unspecified atherosclerosis: I70.90

## 2019-08-25 HISTORY — DX: Personal history of transient ischemic attack (TIA), and cerebral infarction without residual deficits: Z86.73

## 2019-11-08 ENCOUNTER — Encounter: Payer: Self-pay | Admitting: *Deleted

## 2019-11-08 ENCOUNTER — Encounter: Payer: Self-pay | Admitting: Cardiology

## 2019-11-08 ENCOUNTER — Telehealth: Payer: Self-pay | Admitting: *Deleted

## 2019-11-08 DIAGNOSIS — I2510 Atherosclerotic heart disease of native coronary artery without angina pectoris: Secondary | ICD-10-CM | POA: Insufficient documentation

## 2019-11-08 DIAGNOSIS — I639 Cerebral infarction, unspecified: Secondary | ICD-10-CM | POA: Insufficient documentation

## 2019-11-08 DIAGNOSIS — J302 Other seasonal allergic rhinitis: Secondary | ICD-10-CM | POA: Insufficient documentation

## 2019-11-08 NOTE — Telephone Encounter (Signed)
   Primary Cardiologist: Garwin Brothers, MD  Chart reviewed as part of pre-operative protocol coverage. Because of Bengie Kooyman's past medical history and time since last visit, he will require a follow-up visit in order to better assess preoperative cardiovascular risk.  Pre-op covering staff: - Please schedule appointment and call patient to inform them. If patient already had an upcoming appointment within acceptable timeframe, please add "pre-op clearance" to the appointment notes so provider is aware. - Please contact requesting surgeon's office via preferred method (i.e, phone, fax) to inform them of need for appointment prior to surgery.  If applicable, this message will also be routed to pharmacy pool and/or primary cardiologist for input on holding anticoagulant/antiplatelet agent as requested below so that this information is available to the clearing provider at time of patient's appointment.    [facility]: it appears this patient takes plavix for stroke. Please reach out to his neurologist or PCP for guidance to hold plavix. We do not manage this medication.    Roe Rutherford [place] Lasseter, PA  11/08/2019, 9:42 AM

## 2019-11-08 NOTE — Telephone Encounter (Signed)
   [facility] Pre-operative Risk Assessment    Request for surgical clearance:  1. What type of surgery is being performed? Lumbar 5-S1 posterior lumbar interbody Fusion   2. When is this surgery scheduled? TBD   3. What type of clearance is required (medical clearance vs. Pharmacy clearance to hold med vs. Both)? Both  4. Are there any medications that need to be held prior to surgery and how long?Aspirin, Plavix   5. Practice name and name of physician performing surgery? [facility], Dr. Creig Hines, MD  6. What is your office phone number [phone]    7.   What is your office fax number [phone]  8.   Anesthesia type (None, local, MAC, general) ? General Anesthesia   Kyle Gray 11/08/2019, 9:07 AM  _________________________________________________________________   (provider comments below)

## 2019-11-08 NOTE — Telephone Encounter (Signed)
Patient has an appointment with [name] 11/18/19 at 4:20pm.

## 2019-11-18 ENCOUNTER — Ambulatory Visit (INDEPENDENT_AMBULATORY_CARE_PROVIDER_SITE_OTHER): Payer: BC Managed Care – PPO | Admitting: Cardiology

## 2019-11-18 ENCOUNTER — Encounter: Payer: Self-pay | Admitting: Cardiology

## 2019-11-18 ENCOUNTER — Other Ambulatory Visit: Payer: Self-pay

## 2019-11-18 VITALS — BP 138/80 | HR 78 | Ht 68.0 in | Wt 215.1 lb

## 2019-11-18 DIAGNOSIS — E782 Mixed hyperlipidemia: Secondary | ICD-10-CM

## 2019-11-18 DIAGNOSIS — I10 Essential (primary) hypertension: Secondary | ICD-10-CM | POA: Diagnosis not present

## 2019-11-18 DIAGNOSIS — I723 Aneurysm of iliac artery: Secondary | ICD-10-CM

## 2019-11-18 DIAGNOSIS — Z01810 Encounter for preprocedural cardiovascular examination: Secondary | ICD-10-CM

## 2019-11-18 DIAGNOSIS — I2510 Atherosclerotic heart disease of native coronary artery without angina pectoris: Secondary | ICD-10-CM | POA: Diagnosis not present

## 2019-11-18 NOTE — Patient Instructions (Signed)
Medication Instructions:  No medication changes. *If you need a refill on your cardiac medications before your next appointment, please call your pharmacy*   Lab Work: None ordered If you have labs (blood work) drawn today and your tests are completely normal, you will receive your results only by: Marland Kitchen MyChart Message (if you have MyChart) OR . A paper copy in the mail If you have any lab test that is abnormal or we need to change your treatment, we will call you to review the results.   Testing/Procedures:   Your physician has requested that you have a lexiscan myoview. For further information please visit [email or web address] Please follow instruction sheet, as given.  The test will take approximately 3 to 4 hours to complete; you may bring reading material.  If someone comes with you to your appointment, they will need to remain in the main lobby due to limited space in the testing area.   How to prepare for your Myocardial Perfusion Test: . Do not eat or drink 3 hours prior to your test, except you may have water. . Do not consume products containing caffeine (regular or decaffeinated) 12 hours prior to your test. (ex: coffee, chocolate, sodas, tea). . Do bring a list of your current medications with you.  If not listed below, you may take your medications as normal. . Do wear comfortable clothes (no dresses or overalls) and walking shoes, tennis shoes preferred (No heels or open toe shoes are allowed). . Do NOT wear cologne, perfume, aftershave, or lotions (deodorant is allowed). . If these instructions are not followed, your test will have to be rescheduled.    Follow-Up: At [facility], you and your health needs are our priority.  As part of our continuing mission to provide you with exceptional heart care, we have created designated Provider Care Teams.  These Care Teams include your primary Cardiologist (physician) and Advanced Practice Providers (APPs -  Physician Assistants  and Nurse Practitioners) who all work together to provide you with the care you need, when you need it.  We recommend signing up for the patient portal called "MyChart".  Sign up information is provided on this After Visit Summary.  MyChart is used to connect with patients for Virtual Visits (Telemedicine).  Patients are able to view lab/test results, encounter notes, upcoming appointments, etc.  Non-urgent messages can be sent to your provider as well.   To learn more about what you can do with MyChart, go to [email or web address].    Your next appointment:   2 month(s)  The format for your next appointment:   In Person  Provider:   Belva Crome, MD   Other Instructions  Cardiac Nuclear Scan  A cardiac nuclear scan is a test that is done to check the flow of blood to your heart. It is done when you are resting and when you are exercising. The test looks for problems such as:  Not enough blood reaching a portion of the heart.  The heart muscle not working as it should. You may need this test if:  You have heart disease.  You have had lab results that are not normal.  You have had heart surgery or a balloon procedure to open up blocked arteries (angioplasty).  You have chest pain.  You have shortness of breath. In this test, a special dye (tracer) is put into your bloodstream. The tracer will travel to your heart. A camera will then take pictures of your heart to see how the  tracer moves through your heart. This test is usually done at a hospital and takes 2-4 hours. Tell a doctor about:  Any allergies you have.  All medicines you are taking, including vitamins, herbs, eye drops, creams, and over-the-counter medicines.  Any problems you or family members have had with anesthetic medicines.  Any blood disorders you have.  Any surgeries you have had.  Any medical conditions you have.  Whether you are pregnant or may be pregnant. What are the risks? Generally, this  is a safe test. However, problems may occur, such as:  Serious chest pain and heart attack. This is only a risk if the stress portion of the test is done.  Rapid heartbeat.  A feeling of warmth in your chest. This feeling usually does not last long.  Allergic reaction to the tracer. What happens before the test?  Ask your doctor about changing or stopping your normal medicines. This is important.  Follow instructions from your doctor about what you cannot eat or drink.  Remove your jewelry on the day of the test. What happens during the test? 1. An IV tube will be inserted into one of your veins. 2. Your doctor will give you a small amount of tracer through the IV tube. 3. You will wait for 20-40 minutes while the tracer moves through your bloodstream. 4. Your heart will be monitored with an electrocardiogram (ECG). 5. You will lie down on an exam table. 6. Pictures of your heart will be taken for about 15-20 minutes. 7. You may also have a stress test. For this test, one of these things may be done: ? You will be asked to exercise on a treadmill or a stationary bike. ? You will be given medicines that will make your heart work harder. This is done if you are unable to exercise. 8. When blood flow to your heart has peaked, a tracer will again be given through the IV tube. 9. After 20-40 minutes, you will get back on the exam table. More pictures will be taken of your heart. 10. Depending on the tracer that is used, more pictures may need to be taken 3-4 hours later. 11. Your IV tube will be removed when the test is over. The test may vary among doctors and hospitals. What happens after the test? 1. Ask your doctor: ? Whether you can return to your normal schedule, including diet, activities, and medicines. ? Whether you should drink more fluids. This will help to remove the tracer from your body. Drink enough fluid to keep your pee (urine) pale yellow. 2. Ask your doctor, or the  department that is doing the test: ? When will my results be ready? ? How will I get my results? Summary  A cardiac nuclear scan is a test that is done to check the flow of blood to your heart.  Tell your doctor whether you are pregnant or may be pregnant.  Before the test, ask your doctor about changing or stopping your normal medicines. This is important.  Ask your doctor whether you can return to your normal activities. You may be asked to drink more fluids. This information is not intended to replace advice given to you by your health care provider. Make sure you discuss any questions you have with your health care provider. Document Revised: 05/12/2018 Document Reviewed: 07/06/2017 Elsevier Patient Education  2020 [employer].

## 2019-11-18 NOTE — Progress Notes (Signed)
Cardiology Office Note:    Date:  11/18/2019   ID:  Kyle Gray, DOB [birth year], MRN [id number]  PCP:  Maris Berger, MD  Cardiologist:  Garwin Brothers, MD   Referring MD: Maris Berger, MD    ASSESSMENT:    1. Coronary artery disease involving native coronary artery of native heart without angina pectoris   2. Essential hypertension   3. Mixed hyperlipidemia   4. Iliac artery aneurysm, bilateral (HCC)    PLAN:    In order of problems listed above:  1. Preoperative risk stratification: Secondary prevention stressed with patient.  Importance of compliance with diet medication stressed any vocalized understanding.  He has planned to go for significant surgery.  He has coronary artery disease and multiple risk factors in view of this I will do a Lexiscan sestamibi.  If this is negative he is not at high risk for coronary events during the aforementioned surgery.  Meticulous hemodynamic monitoring and uninterrupted continued beta-blockade will further reduce the risk of coronary events. 2. Coronary artery disease: Secondary prevention stressed. 3. Essential hypertension: Blood pressure stable and diet and lifestyle modification was advised 4. Mixed dyslipidemia: On statin therapy.  Lipids were reviewed with the patient. 5. Diabetes mellitus: Managed by primary care physician.  Diet emphasized. 6. Patient will be seen in follow-up appointment in 6 months or earlier if the patient has any concerns    Medication Adjustments/Labs and Tests Ordered: Current medicines are reviewed at length with the patient today.  Concerns regarding medicines are outlined above.  No orders of the defined types were placed in this encounter.  No orders of the defined types were placed in this encounter.    No chief complaint on file.    History of Present Illness:    Kyle Gray is a 59 y.o. male.  Patient has past medical history of coronary artery disease, essential hypertension  and dyslipidemia.  He denies any problems at this time and takes care of activities of daily living.  No chest pain orthopnea or PND.  At the time of my evaluation, the patient is alert awake oriented and in no distress.  He is planning to undergo back surgery.  He leads a sedentary lifestyle because of his orthopedic issues.  Past Medical History:  Diagnosis Date   Acute ill-defined cerebrovascular disease 04/06/2013   Arteriosclerotic vascular disease 08/25/2019   Atopic dermatitis 06/17/2018   Benign hypertensive heart disease 06/11/2018   Benign paroxysmal positional vertigo 06/17/2018   Bilateral carotid artery stenosis 07/16/2016   CAD (coronary artery disease)    Carpal tunnel syndrome 05/14/2015   Diabetes mellitus due to underlying condition with unspecified complications (HCC) 06/17/2018   Essential hypertension 06/17/2018   History of cerebrovascular accident 08/25/2019   Hyperlipidemia 06/03/2013   Hypertension 04/06/2013   Hypokalemia 06/11/2018   Iliac artery aneurysm, bilateral (HCC) 07/16/2016   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   Mild CAD 10/18/2014   luminal irregularities on angiography March 2011 luminal irregularities on angiography March 2011   Non-cardiac chest pain 06/11/2018   Paroxysmal SVT (supraventricular tachycardia) (HCC) 10/18/2014   catheter ablation procedure for treatment of supraventricular tachycardia 2015 catheter ablation procedure for treatment of supraventricular tachycardia 2015   Peripheral vascular disease (HCC) 06/11/2018   Pharyngitis 06/17/2018   Sciatica 06/17/2018   Seasonal allergies    Shortness of breath 06/11/2018   Stroke [facility])    Thoracic or lumbosacral neuritis or radiculitis 04/06/2013   Vertebral artery  occlusion 05/14/2015   Vertigo 06/17/2018    Past Surgical History:  Procedure Laterality Date   CARDIAC SURGERY     EYE SURGERY     HERNIA REPAIR     IR ANGIO INTRA  EXTRACRAN SEL COM CAROTID INNOMINATE BILAT MOD SED  08/19/2019   IR ANGIO VERTEBRAL SEL SUBCLAVIAN INNOMINATE UNI L MOD SED  08/19/2019   IR ANGIO VERTEBRAL SEL VERTEBRAL UNI R MOD SED  08/19/2019   STENT PLACEMENT VASCULAR ([facility] HX)      Current Medications: Current Meds  Medication Sig   albuterol (VENTOLIN HFA) 108 (90 Base) MCG/ACT inhaler Inhale 1-2 puffs into the lungs every 6 (six) hours as needed for wheezing or shortness of breath.   Alirocumab (PRALUENT) 150 MG/ML SOAJ Inject into the skin every 14 (fourteen) days.    amLODipine (NORVASC) 10 MG tablet Take 10 mg by mouth daily.   ASPIRIN 81 PO Take 81 mg by mouth daily.    cetirizine (ZYRTEC) 10 MG tablet Take 10 mg by mouth daily as needed.   clopidogrel (PLAVIX) 75 MG tablet Take 75 mg by mouth daily.   empagliflozin (JARDIANCE) 10 MG TABS tablet Take 10 mg by mouth daily.   famotidine (PEPCID) 20 MG tablet Take 20 mg by mouth daily.   folic acid (FOLVITE) 1 MG tablet Take 1 mg by mouth daily.   latanoprost (XALATAN) 0.005 % ophthalmic solution 1 drop at bedtime.   losartan-hydrochlorothiazide (HYZAAR) 50-12.5 MG tablet Take 1 tablet by mouth daily.   metFORMIN (GLUCOPHAGE) 500 MG tablet Take 500 mg by mouth 2 (two) times daily with a meal.    metoprolol succinate (TOPROL-XL) 50 MG 24 hr tablet Take 50 mg by mouth daily.   rosuvastatin (CRESTOR) 40 MG tablet Take 40 mg by mouth daily.     Allergies:   Patient has no known allergies.   Social History   Socioeconomic History   Marital status: Married    Spouse name: Not on file   Number of children: Not on file   Years of education: Not on file   Highest education level: Not on file  Occupational History   Not on file  Tobacco Use   Smoking status: Current Every Day Smoker   Smokeless tobacco: Never Used  Vaping Use   Vaping Use: Never used  Substance and Sexual Activity   Alcohol use: Not Currently   Drug use: Never   Sexual activity:  Not on file  Other Topics Concern   Not on file  Social History Narrative   Not on file   Social Determinants of Health   Financial Resource Strain:    Difficulty of Paying Living Expenses: Not on file  Food Insecurity:    Worried About Running Out of Food in the Last Year: Not on file   Ran Out of Food in the Last Year: Not on file  Transportation Needs:    Lack of Transportation (Medical): Not on file   Lack of Transportation (Non-Medical): Not on file  Physical Activity:    Days of Exercise per Week: Not on file   Minutes of Exercise per Session: Not on file  Stress:    Feeling of Stress : Not on file  Social Connections:    Frequency of Communication with Friends and Family: Not on file   Frequency of Social Gatherings with Friends and Family: Not on file   Attends Religious Services: Not on file   Active Member of Clubs or Organizations: Not on  file   Attends Banker Meetings: Not on file   Marital Status: Not on file     Family History: The patient's family history includes Heart attack in his mother; Stroke in his father and maternal grandfather.  ROS:   Please see the history of present illness.    All other systems reviewed and are negative.  EKGs/Labs/Other Studies Reviewed:    The following studies were reviewed today: EKG was sinus rhythm and nonspecific ST-T changes. I discussed my findings with the patient at extensive length   Recent Labs: 08/19/2019: BUN 9; Creatinine, Ser 0.99; Hemoglobin 14.8; Platelets 164; Potassium 3.4; Sodium 142  Recent Lipid Panel No results found for: CHOL, TRIG, HDL, CHOLHDL, VLDL, LDLCALC, LDLDIRECT  Physical Exam:    VS:  BP 138/80    Pulse 78    Ht 5\' 8"  (1.727 m)    Wt 215 lb 1.9 oz (97.6 kg)    SpO2 98%    BMI 32.71 kg/m     Wt Readings from Last 3 Encounters:  11/18/19 215 lb 1.9 oz (97.6 kg)  11/04/19 216 lb (98 kg)  08/19/19 232 lb (105.2 kg)     GEN: Patient is in no acute  distress HEENT: Normal NECK: No JVD; No carotid bruits LYMPHATICS: No lymphadenopathy CARDIAC: Hear sounds regular, 2/6 systolic murmur at the apex. RESPIRATORY:  Clear to auscultation without rales, wheezing or rhonchi  ABDOMEN: Soft, non-tender, non-distended MUSCULOSKELETAL:  No edema; No deformity  SKIN: Warm and dry NEUROLOGIC:  Alert and oriented x 3 PSYCHIATRIC:  Normal affect   Signed, 08/21/19, MD  11/18/2019 3:46 PM    [facility]

## 2019-11-22 ENCOUNTER — Ambulatory Visit (INDEPENDENT_AMBULATORY_CARE_PROVIDER_SITE_OTHER): Payer: BC Managed Care – PPO

## 2019-11-22 ENCOUNTER — Telehealth: Payer: Self-pay | Admitting: *Deleted

## 2019-11-22 ENCOUNTER — Other Ambulatory Visit: Payer: Self-pay

## 2019-11-22 DIAGNOSIS — I2510 Atherosclerotic heart disease of native coronary artery without angina pectoris: Secondary | ICD-10-CM

## 2019-11-22 DIAGNOSIS — Z01810 Encounter for preprocedural cardiovascular examination: Secondary | ICD-10-CM | POA: Diagnosis not present

## 2019-11-22 LAB — MYOCARDIAL PERFUSION IMAGING
LV dias vol: 96 mL (ref 62–150)
LV sys vol: 31 mL
Peak HR: 88 {beats}/min
Rest HR: 64 {beats}/min
SDS: 0
SRS: 0
SSS: 0
TID: 1.21

## 2019-11-22 MED ORDER — TECHNETIUM TC 99M TETROFOSMIN IV KIT
11.0000 | PACK | Freq: Once | INTRAVENOUS | Status: AC | PRN
  Administered 2019-11-22: 11 via INTRAVENOUS

## 2019-11-22 MED ORDER — TECHNETIUM TC 99M TETROFOSMIN IV KIT
31.4000 | PACK | Freq: Once | INTRAVENOUS | Status: AC | PRN
  Administered 2019-11-22: 31.4 via INTRAVENOUS

## 2019-11-22 MED ORDER — REGADENOSON 0.4 MG/5ML IV SOLN
0.4000 mg | Freq: Once | INTRAVENOUS | Status: AC
  Administered 2019-11-22: 0.4 mg via INTRAVENOUS

## 2019-11-22 NOTE — Telephone Encounter (Signed)
Patient given detailed instructions per Myocardial Perfusion Study Information Sheet for the test on 11/22/2019 at 1115. Patient notified to arrive 15 minutes early and that it is imperative to arrive on time for appointment to keep from having the test rescheduled.  If you need to cancel or reschedule your appointment, please call the office within 24 hours of your appointment. . Patient verbalized understanding.Jorian [place]ho[place], Adelene Idler

## 2019-11-23 NOTE — Telephone Encounter (Signed)
[place] with [facility] is requesting stress test results for clearance.   Phone number: [phone] Fax number: [phone]

## 2019-11-23 NOTE — Telephone Encounter (Signed)
Results were sent to PCP via Epic.

## 2019-11-29 NOTE — Telephone Encounter (Signed)
lexiscan has been completed 

## 2019-11-29 NOTE — Telephone Encounter (Signed)
Patient is calling stating a letter for clearance was not received.  He would like for the letter to be sent to the person who is doing his surgery

## 2019-11-30 NOTE — Telephone Encounter (Signed)
   Primary Cardiologist: Garwin Brothers, MD  Chart reviewed as part of pre-operative protocol coverage. Patient was seen in the office 11/18/19 and ultimately underwent pre-operative stress test. The results of the stress test were normal, overall a low risk study. Given past medical history and time since last visit, based on ACC/AHA guidelines, Kyle Gray would be at acceptable risk for the planned procedure without further cardiovascular testing.   The patient was advised that if he develops new symptoms prior to surgery to contact our office to arrange for a follow-up visit, and he verbalized understanding.  I will route this recommendation to the requesting party via Epic fax function and remove from pre-op pool.  Please call with questions.  Emilina Smarr David Stall, PA-C 11/30/2019, 7:45 AM

## 2019-12-02 DIAGNOSIS — Z01818 Encounter for other preprocedural examination: Secondary | ICD-10-CM

## 2020-03-06 ENCOUNTER — Other Ambulatory Visit (HOSPITAL_COMMUNITY): Payer: Self-pay | Admitting: Interventional Radiology

## 2020-03-06 ENCOUNTER — Telehealth (HOSPITAL_COMMUNITY): Payer: Self-pay

## 2020-03-06 NOTE — Telephone Encounter (Signed)
Called to schedule mri/mra, no answer, left vm. AW  

## 2020-03-08 ENCOUNTER — Other Ambulatory Visit (HOSPITAL_COMMUNITY): Payer: Self-pay | Admitting: Interventional Radiology

## 2020-03-08 DIAGNOSIS — I771 Stricture of artery: Secondary | ICD-10-CM

## 2020-03-09 ENCOUNTER — Ambulatory Visit (HOSPITAL_COMMUNITY)
Admission: RE | Admit: 2020-03-09 | Discharge: 2020-03-09 | Disposition: A | Discharge status: Home / Self Care | Payer: BC Managed Care – PPO | Source: Ambulatory Visit | Attending: Interventional Radiology | Admitting: Interventional Radiology

## 2020-03-09 ENCOUNTER — Other Ambulatory Visit: Payer: Self-pay

## 2020-03-09 DIAGNOSIS — I771 Stricture of artery: Secondary | ICD-10-CM | POA: Diagnosis not present

## 2020-03-12 ENCOUNTER — Telehealth (HOSPITAL_COMMUNITY): Payer: Self-pay

## 2020-03-12 NOTE — Telephone Encounter (Signed)
Pt agreed to f/u in 6 months with cta head/neck. Pt would like to have PCP take over scheduling f/u's so that he can have done in [place] at [place]. He will speak to PCP and let me know. AW

## 2020-03-26 ENCOUNTER — Telehealth (HOSPITAL_COMMUNITY): Payer: Self-pay

## 2020-03-26 NOTE — Telephone Encounter (Signed)
Pt called to let us know that he will do his next f/u cta here at [facility]. Call patient and give date and time so that he can make appt. AW

## 2020-09-19 ENCOUNTER — Other Ambulatory Visit: Payer: Self-pay | Admitting: Orthopedic Surgery

## 2020-09-24 NOTE — Pre-Procedure Instructions (Signed)
Surgical Instructions    Your procedure is scheduled on Thursday, September 1st, 2022.  Report to [facility] Main Entrance "A" at 10:00 A.M., then check in with the Admitting office.  Call this number if you have problems the morning of surgery:  [phone]   If you have any questions prior to your surgery date call [phone]: Open Monday-Friday 8am-4pm    Remember:  Do not eat after midnight the night before your surgery  You may drink clear liquids until 10:00 A.M. the morning of your surgery.   Clear liquids allowed are: Water, Non-Citrus Juices (without pulp), Carbonated Beverages, Clear Tea, Black Coffee with (NO MILK, CREAM OR POWDERED CREAMER), and Gatorade    Enhanced Recovery after Surgery for Orthopedics Enhanced Recovery after Surgery is a protocol used to improve the stress on your body and your recovery after surgery.  Patient Instructions  The day of surgery (if you have diabetes):  Drink ONE Gatorade G2 by 10:00 A.M. the morning of surgery This bottle was given to you during your hospital  pre-op appointment visit.  Nothing else to drink after completing the Gatorade G2.         If you have questions, please contact your surgeon's office.    Take these medicines the morning of surgery with A SIP OF WATER:  amLODipine (NORVASC)  metoprolol succinate (TOPROL-XL) rosuvastatin (CRESTOR)      Take these medications the morning of surgery AS NEEDED: albuterol (VENTOLIN HFA) cetirizine (ZYRTEC) metaxalone (SKELAXIN) omeprazole (PRILOSEC) pregabalin (LYRICA) tizanidine (ZANAFLEX) traMADol (ULTRAM)  Follow your surgeon's instructions on when to stop clopidogrel (PLAVIX).  If no instructions were given by your surgeon then you will need to call the office to get those instructions.      As of today, STOP taking any Aspirin (unless otherwise instructed by your surgeon) Aleve, Naproxen, Ibuprofen, Motrin, Advil, Goody's, BC's, all herbal medications, fish  oil, and all vitamins.   WHAT DO I DO ABOUT MY DIABETES MEDICATION?   Do not take oral diabetes medicines (pills) the morning of surgery.  empagliflozin (JARDIANCE) Do not take Wednesday, 10/03/20 or Thursday, 10/04/20 metFORMIN (GLUCOPHAGE)  Do NOT take Thursday, 10/04/20 glimepiride (AMARYL) On Wednesday, 10/03/20, take only morning and/or lunch doses. Do NOT take evening dose Do NOT take Thursday, 10/04/20    The day of surgery, do not take other diabetes injectables, including Byetta (exenatide), Bydureon (exenatide ER), Victoza (liraglutide), or Trulicity (dulaglutide).   HOW TO MANAGE YOUR DIABETES BEFORE AND AFTER SURGERY  Why is it important to control my blood sugar before and after surgery? Improving blood sugar levels before and after surgery helps healing and can limit problems. A way of improving blood sugar control is eating a healthy diet by:  Eating less sugar and carbohydrates  Increasing activity/exercise  Talking with your doctor about reaching your blood sugar goals High blood sugars (greater than 180 mg/dL) can raise your risk of infections and slow your recovery, so you will need to focus on controlling your diabetes during the weeks before surgery. Make sure that the doctor who takes care of your diabetes knows about your planned surgery including the date and location.  How do I manage my blood sugar before surgery? Check your blood sugar at least 4 times a day, starting 2 days before surgery, to make sure that the level is not too high or low.  Check your blood sugar the morning of your surgery when you wake up and every 2 hours until you get  to the Short Stay unit.  If your blood sugar is less than 70 mg/dL, you will need to treat for low blood sugar: Treat a low blood sugar (less than 70 mg/dL) with  cup of clear juice (cranberry or apple), 4 glucose tablets, OR glucose gel. Recheck blood sugar in 15 minutes after treatment (to make sure it is greater than  70 mg/dL). If your blood sugar is not greater than 70 mg/dL on recheck, call [phone] for further instructions. Report your blood sugar to the short stay nurse when you get to Short Stay.  If you are admitted to the hospital after surgery: Your blood sugar will be checked by the staff and you will probably be given insulin after surgery (instead of oral diabetes medicines) to make sure you have good blood sugar levels. The goal for blood sugar control after surgery is 80-180 mg/dL.            Do not wear jewelry Do not wear lotions, powders, colognes, or deodorant. Men may shave face and neck. Do not bring valuables to the hospital.             [facility] is not responsible for any belongings or valuables.  Do NOT Smoke (Tobacco/Vaping) or drink Alcohol 24 hours prior to your procedure If you use a CPAP at night, you may bring all equipment for your overnight stay.   Contacts, glasses, dentures or bridgework may not be worn into surgery, please bring cases for these belongings   For patients admitted to the hospital, discharge time will be determined by your treatment team.   Patients discharged the day of surgery will not be allowed to drive home, and someone needs to stay with them for 24 hours.  ONLY 1 SUPPORT PERSON MAY BE PRESENT WHILE YOU ARE IN SURGERY. IF YOU ARE TO BE ADMITTED ONCE YOU ARE IN YOUR ROOM YOU WILL BE ALLOWED TWO (2) VISITORS.  Minor children may have two parents present. Special consideration for safety and communication needs will be reviewed on a case by case basis.  Special instructions:    Oral Hygiene is also important to reduce your risk of infection.  Remember - BRUSH YOUR TEETH THE MORNING OF SURGERY WITH YOUR REGULAR TOOTHPASTE   [address]- Preparing For Surgery  Before surgery, you can play an important role. Because skin is not sterile, your skin needs to be as free of germs as possible. You can reduce the number of germs on your skin by  washing with CHG (chlorahexidine gluconate) Soap before surgery.  CHG is an antiseptic cleaner which kills germs and bonds with the skin to continue killing germs even after washing.     Please do not use if you have an allergy to CHG or antibacterial soaps. If your skin becomes reddened/irritated stop using the CHG.  Do not shave (including legs and underarms) for at least 48 hours prior to first CHG shower. It is OK to shave your face.  Please follow these instructions carefully.     Shower the NIGHT BEFORE SURGERY and the MORNING OF SURGERY with CHG Soap.   If you chose to wash your hair, wash your hair first as usual with your normal shampoo. After you shampoo, rinse your hair and body thoroughly to remove the shampoo.  Then [employer] and genitals (private parts) with your normal soap and rinse thoroughly to remove soap.  After that Use CHG Soap as you would any other liquid soap. You can  apply CHG directly to the skin and wash gently with a scrungie or a clean washcloth.   Apply the CHG Soap to your body ONLY FROM THE NECK DOWN.  Do not use on open wounds or open sores. Avoid contact with your eyes, ears, mouth and genitals (private parts). Wash Face and genitals (private parts)  with your normal soap.   Wash thoroughly, paying special attention to the area where your surgery will be performed.  Thoroughly rinse your body with warm water from the neck down.  DO NOT shower/wash with your normal soap after using and rinsing off the CHG Soap.  Pat yourself dry with a CLEAN TOWEL.  Wear CLEAN PAJAMAS to bed the night before surgery  Place CLEAN SHEETS on your bed the night before your surgery  DO NOT SLEEP WITH PETS.   Day of Surgery:  Take a shower with CHG soap. Wear Clean/Comfortable clothing the morning of surgery Do not apply any deodorants/lotions.   Remember to brush your teeth WITH YOUR REGULAR TOOTHPASTE.   Please read over the following fact sheets that you were  given.

## 2020-09-25 ENCOUNTER — Other Ambulatory Visit: Payer: Self-pay

## 2020-09-25 ENCOUNTER — Encounter (HOSPITAL_COMMUNITY): Payer: Self-pay

## 2020-09-25 ENCOUNTER — Encounter (HOSPITAL_COMMUNITY)
Admission: RE | Admit: 2020-09-25 | Discharge: 2020-09-25 | Disposition: A | Discharge status: Home / Self Care | Payer: BC Managed Care – PPO | Source: Ambulatory Visit | Attending: Orthopedic Surgery | Admitting: Orthopedic Surgery

## 2020-09-25 DIAGNOSIS — Z01812 Encounter for preprocedural laboratory examination: Secondary | ICD-10-CM | POA: Diagnosis not present

## 2020-09-25 HISTORY — DX: Unspecified osteoarthritis, unspecified site: M19.90

## 2020-09-25 HISTORY — DX: Unspecified asthma, uncomplicated: J45.909

## 2020-09-25 LAB — COMPREHENSIVE METABOLIC PANEL
ALT: 21 U/L (ref 0–44)
AST: 21 U/L (ref 15–41)
Albumin: 4.1 g/dL (ref 3.5–5.0)
Alkaline Phosphatase: 105 U/L (ref 38–126)
Anion gap: 10 (ref 5–15)
BUN: 11 mg/dL (ref 8–23)
CO2: 27 mmol/L (ref 22–32)
Calcium: 9.7 mg/dL (ref 8.9–10.3)
Chloride: 100 mmol/L (ref 98–111)
Creatinine, Ser: 0.95 mg/dL (ref 0.61–1.24)
GFR, Estimated: >60 mL/min (ref >60)
Glucose, Bld: 206 mg/dL — ABNORMAL HIGH (ref 70–99)
Potassium: 3.3 mmol/L — ABNORMAL LOW (ref 3.5–5.1)
Sodium: 137 mmol/L (ref 135–145)
Total Bilirubin: 1.3 mg/dL — ABNORMAL HIGH (ref 0.3–1.2)
Total Protein: 7.4 g/dL (ref 6.5–8.1)

## 2020-09-25 LAB — TYPE AND SCREEN
ABO/RH(D): O POS
Antibody Screen: NEGATIVE

## 2020-09-25 LAB — CBC WITH DIFFERENTIAL/PLATELET
Abs Immature Granulocytes: 0.03 10*3/uL (ref 0.00–0.07)
Basophils Absolute: 0.1 10*3/uL (ref 0.0–0.1)
Basophils Relative: 1 %
Eosinophils Absolute: 0.1 10*3/uL (ref 0.0–0.5)
Eosinophils Relative: 1 %
HCT: 51.2 % (ref 39.0–52.0)
Hemoglobin: 16.8 g/dL (ref 13.0–17.0)
Immature Granulocytes: 0 %
Lymphocytes Relative: 34 %
Lymphs Abs: 2.8 10*3/uL (ref 0.7–4.0)
MCH: 30.2 pg (ref 26.0–34.0)
MCHC: 32.8 g/dL (ref 30.0–36.0)
MCV: 91.9 fL (ref 80.0–100.0)
Monocytes Absolute: 0.5 10*3/uL (ref 0.1–1.0)
Monocytes Relative: 6 %
Neutro Abs: 4.8 10*3/uL (ref 1.7–7.7)
Neutrophils Relative %: 58 %
Platelets: 166 10*3/uL (ref 150–400)
RBC: 5.57 MIL/uL (ref 4.22–5.81)
RDW: 12.8 % (ref 11.5–15.5)
WBC: 8.3 10*3/uL (ref 4.0–10.5)
nRBC: 0 % (ref 0.0–0.2)

## 2020-09-25 LAB — GLUCOSE, CAPILLARY: Glucose-Capillary: 296 mg/dL — ABNORMAL HIGH (ref 70–99)

## 2020-09-25 LAB — HEMOGLOBIN A1C
Hgb A1c MFr Bld: 9.4 % — ABNORMAL HIGH (ref 4.8–5.6)
Mean Plasma Glucose: 223.08 mg/dL

## 2020-09-25 LAB — APTT: aPTT: 26 seconds (ref 24–36)

## 2020-09-25 LAB — SURGICAL PCR SCREEN
MRSA, PCR: NEGATIVE
Staphylococcus aureus: NEGATIVE

## 2020-09-25 LAB — PROTIME-INR
INR: 1.1 (ref 0.8–1.2)
Prothrombin Time: 13.9 seconds (ref 11.4–15.2)

## 2020-09-25 LAB — URINALYSIS, ROUTINE W REFLEX MICROSCOPIC
Bacteria, UA: NONE SEEN
Bilirubin Urine: NEGATIVE
Glucose, UA: >=500 mg/dL — AB
Hgb urine dipstick: NEGATIVE
Ketones, ur: NEGATIVE mg/dL
Leukocytes,Ua: NEGATIVE
Nitrite: NEGATIVE
Protein, ur: NEGATIVE mg/dL
Specific Gravity, Urine: 1.022 (ref 1.005–1.030)
pH: 5 (ref 5.0–8.0)

## 2020-09-25 NOTE — Progress Notes (Signed)
PCP - Dr. Laymond Purser Cardiologist -   PPM/ICD - n/a Device Orders -  Rep Notified -   Chest x-ray - n/a EKG - 11/18/2019 Stress Test - 11/22/2019 ECHO - 06/21/2018 Cardiac Cath - 04/29/2009  Sleep Study -  CPAP -   Fasting Blood Sugar - 130 but sometimes up to 200 if patient eats late Checks Blood Sugar 1 time a day  Blood Thinner Instructions: Aspirin Instructions:  ERAS Protcol - PRE-SURGERY Ensure or G2-   COVID TEST- instructed to go get tested at drive thru on    Anesthesia review: heart history  Patient denies shortness of breath, fever, cough and chest pain at PAT appointment   All instructions explained to the patient, with a verbal understanding of the material. Patient agrees to go over the instructions while at home for a better understanding. Patient also instructed to self quarantine after being tested for COVID-19. The opportunity to ask questions was provided.

## 2020-09-25 NOTE — Progress Notes (Signed)
Spoke with Kyle Gray at [name] office and let her know that patient's A1c is 9.4.

## 2020-09-27 ENCOUNTER — Encounter (INDEPENDENT_AMBULATORY_CARE_PROVIDER_SITE_OTHER): Payer: Self-pay

## 2020-09-27 NOTE — Progress Notes (Signed)
Anesthesia Chart Review:  Patient previously seen by [name] on 11/18/2019 for preop evaluation prior to undergoing back surgery.  Due to risk factors of HLD, HTN, DM2, [name] ordered stress test for preoperative stratification.  Stress test was done 11/22/2019 and was low risk, EF 68%, no ischemia.  Patient has history of right vertebral artery stenting by [name] in 2012.  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 He is maintained on Plavix for this.  Pt seen by PCP [name] 09/27/20 for preop clearance. Per note, "reviewed blood work [name] and pt had ekg and cxr on 07/16/20 cxr clear and ekg similar to prevous ekgs ;pt given flovent inhaler to help with mild wheezing ;pt is medium risk for surgery. Patient Instructions: stop metformin day of surgery stop glimeperide day of surgery ;stop asa and plavix day of surgery or earlier as per surgeeons may restart asa plavix metformin day after surgery if permited ;stop smoking "  I spoke with [name] and [name] office regarding Plavix.  She said she will review [name] note with Dr. [name] and he will advise on stopping Plavix.  She will call patient today 09/28/2020 with instructions.  Labs reviewed, DM2 uncontrolled A1c 9.4.  Otherwise unremarkable. A1c result called to [name] scheduler.   EKG 11/18/2019: Sinus rhythm with PACs.  Rate 78.  Anteroseptal infarct, age undetermined.  Nuclear stress 11/22/2019: The left ventricular ejection fraction is hyperdynamic (>65%). Nuclear stress EF: 68%. There was no ST segment deviation noted during stress. No T wave inversion was noted during stress. The study is normal. This is a low risk study.  TTE 06/21/2018:  1. The left ventricle has normal systolic function with an ejection  fraction of 60-65%. The cavity size was normal. There is mildly increased  left ventricular wall thickness. Left  ventricular diastolic Doppler  parameters are consistent with  pseudonormalization.   2. The right ventricle has normal systolic function. The cavity was  normal. There is no increase in right ventricular wall thickness.   Cerebral angiogram 08/19/2019: S/P bilateral common carotid ,RT vert artery and Lt subclavian arteriograms. RT CFA approach. Findings. 1. 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 S.Deveshwar MD  Carotid ultrasound 08/11/2016 (results per Dr. [name]'s note 09/08/2016): Carotid duplex 1-39% stenosis (B) with patent (R) vertebral stent  [name] [facility] Short Stay Center/Anesthesiology Phone [phone] 09/28/2020 9:10 AM

## 2020-09-28 NOTE — Anesthesia Preprocedure Evaluation (Addendum)
Anesthesia Evaluation  Patient identified by MRN, date of birth, ID band Patient awake    Reviewed: Allergy & Precautions, NPO status , Patient's Chart, lab work & pertinent test results, reviewed documented beta blocker date and time   Airway Mallampati: II  TM Distance: >3 FB Neck ROM: Full    Dental  (+) Dental Advisory Given, Missing,    Pulmonary asthma , Current Smoker and Patient abstained from smoking.,    Pulmonary exam normal breath sounds clear to auscultation       Cardiovascular hypertension, Pt. on medications and Pt. on home beta blockers (-) angina+ CAD, + Past MI and + Peripheral Vascular Disease  (-) Cardiac Stents Normal cardiovascular exam+ dysrhythmias Supra Ventricular Tachycardia  Rhythm:Regular Rate:Normal     Neuro/Psych  Neuromuscular disease CVA, No Residual Symptoms negative psych ROS   GI/Hepatic Neg liver ROS, GERD  Medicated,  Endo/Other  diabetes, Poorly Controlled, Type 2, Oral Hypoglycemic AgentsObesity   Renal/GU negative Renal ROS     Musculoskeletal  (+) Arthritis ,   Abdominal   Peds  Hematology  (+) Blood dyscrasia (Plavix), ,   Anesthesia Other Findings   Reproductive/Obstetrics                           Anesthesia Physical Anesthesia Plan  ASA: 3  Anesthesia Plan: General   Post-op Pain Management:    Induction: Intravenous  PONV Risk Score and Plan: 2 and Midazolam, Ondansetron and Diphenhydramine  Airway Management Planned: Oral ETT and Video Laryngoscope Planned  Additional Equipment: Arterial line  Intra-op Plan:   Post-operative Plan: Extubation in OR  Informed Consent: I have reviewed the patients History and Physical, chart, labs and discussed the procedure including the risks, benefits and alternatives for the proposed anesthesia with the patient or authorized representative who has indicated his/her understanding and acceptance.      Dental advisory given  Plan Discussed with: CRNA  Anesthesia Plan Comments: (CLEAR SIGHT, 2nd PIV  PAT note by Antionette Poles, PA-C: Patient previously seen by [name] on 11/18/2019 for preop evaluation prior to undergoing back surgery.  Due to risk factors of HLD, HTN, DM2, [name] ordered stress test for preoperative stratification.  Stress test was done 11/22/2019 and was low risk, EF 68%, no ischemia.  Patient has history of right vertebral artery stenting by [name] in 2012.  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 He is maintained on Plavix for this.  Pt seen by PCP [name] 09/27/20 for preop clearance. Per note, "reviewed blood work  and pt had ekg and cxr on 07/16/20 cxr clear and ekg similar to prevous ekgs ;pt given flovent inhaler to help with mild wheezing ;pt is medium risk for surgery. Patient Instructions: stop metformin day of surgery stop glimeperide day of surgery ;stop asa and plavix day of surgery or earlier as per surgeeons may restart asa plavix metformin day after surgery if permited ;stop smoking "  I spoke with [name] and [name] office regarding Plavix.  She said she will review [name] note with Dr. [name] and he will advise on stopping Plavix.  She will call patient today 09/28/2020 with instructions.  Labs reviewed, DM2 uncontrolled A1c 9.4.  Otherwise unremarkable. A1c result called to [name] scheduler.   EKG 11/18/2019: Sinus rhythm with PACs.  Rate 78.  Anteroseptal infarct, age undetermined.  Nuclear stress 11/22/2019: .  The left ventricular ejection fraction is hyperdynamic (>65%). . Nuclear stress EF: 68%. . There was no ST segment deviation noted during stress. Marland Kitchen No T wave inversion was noted during stress. . The study is normal. . This is a low risk study.  TTE 06/21/2018: 1. The left ventricle has normal systolic function with  an ejection  fraction of 60-65%. The cavity size was normal. There is mildly increased  left ventricular wall thickness. Left ventricular diastolic Doppler  parameters are consistent with  pseudonormalization.  2. The right ventricle has normal systolic function. The cavity was  normal. There is no increase in right ventricular wall thickness.   Cerebral angiogram 08/19/2019: S/P bilateral common carotid ,RT vert artery and Lt subclavian arteriograms. RT CFA approach. Findings. 1. 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 S.Deveshwar MD  Carotid ultrasound 08/11/2016 (results per Dr. [name]'s note 09/08/2016): Carotid duplex 1-39% stenosis (B) with patent (R) vertebral stent )      Anesthesia Quick Evaluation

## 2020-10-01 ENCOUNTER — Other Ambulatory Visit: Payer: Self-pay | Admitting: Orthopedic Surgery

## 2020-10-01 LAB — SARS CORONAVIRUS 2 (TAT 6-24 HRS): SARS Coronavirus 2: NEGATIVE

## 2020-10-03 NOTE — Progress Notes (Signed)
Pt made aware of the surgery time change 0730-1130, arrival time of 0530.

## 2020-10-04 ENCOUNTER — Ambulatory Visit (HOSPITAL_COMMUNITY): Payer: BC Managed Care – PPO

## 2020-10-04 ENCOUNTER — Ambulatory Visit (HOSPITAL_COMMUNITY): Payer: BC Managed Care – PPO | Admitting: Physician Assistant

## 2020-10-04 ENCOUNTER — Ambulatory Visit (HOSPITAL_COMMUNITY): Payer: BC Managed Care – PPO | Admitting: Anesthesiology

## 2020-10-04 ENCOUNTER — Encounter (HOSPITAL_COMMUNITY): Payer: Self-pay | Admitting: Orthopedic Surgery

## 2020-10-04 ENCOUNTER — Other Ambulatory Visit: Payer: Self-pay

## 2020-10-04 ENCOUNTER — Encounter (HOSPITAL_COMMUNITY)
Admission: RE | Disposition: A | Discharge status: Home / Self Care | Payer: Self-pay | Source: Home / Self Care | Attending: Orthopedic Surgery

## 2020-10-04 ENCOUNTER — Ambulatory Visit (HOSPITAL_COMMUNITY)
Admission: RE | Admit: 2020-10-04 | Discharge: 2020-10-04 | Disposition: A | Discharge status: Home / Self Care | Payer: BC Managed Care – PPO | Attending: Orthopedic Surgery | Admitting: Orthopedic Surgery

## 2020-10-04 DIAGNOSIS — Z7984 Long term (current) use of oral hypoglycemic drugs: Secondary | ICD-10-CM | POA: Insufficient documentation

## 2020-10-04 DIAGNOSIS — Z419 Encounter for procedure for purposes other than remedying health state, unspecified: Secondary | ICD-10-CM

## 2020-10-04 DIAGNOSIS — M5412 Radiculopathy, cervical region: Secondary | ICD-10-CM | POA: Diagnosis not present

## 2020-10-04 DIAGNOSIS — F17210 Nicotine dependence, cigarettes, uncomplicated: Secondary | ICD-10-CM | POA: Diagnosis not present

## 2020-10-04 DIAGNOSIS — Z79899 Other long term (current) drug therapy: Secondary | ICD-10-CM | POA: Insufficient documentation

## 2020-10-04 DIAGNOSIS — Z95820 Peripheral vascular angioplasty status with implants and grafts: Secondary | ICD-10-CM | POA: Insufficient documentation

## 2020-10-04 DIAGNOSIS — Z7982 Long term (current) use of aspirin: Secondary | ICD-10-CM | POA: Insufficient documentation

## 2020-10-04 DIAGNOSIS — Z7902 Long term (current) use of antithrombotics/antiplatelets: Secondary | ICD-10-CM | POA: Insufficient documentation

## 2020-10-04 DIAGNOSIS — M4802 Spinal stenosis, cervical region: Secondary | ICD-10-CM | POA: Insufficient documentation

## 2020-10-04 DIAGNOSIS — E119 Type 2 diabetes mellitus without complications: Secondary | ICD-10-CM | POA: Insufficient documentation

## 2020-10-04 DIAGNOSIS — Z8673 Personal history of transient ischemic attack (TIA), and cerebral infarction without residual deficits: Secondary | ICD-10-CM | POA: Insufficient documentation

## 2020-10-04 HISTORY — PX: ANTERIOR CERVICAL DECOMPRESSION/DISCECTOMY FUSION 4 LEVELS: SHX5556

## 2020-10-04 LAB — GLUCOSE, CAPILLARY
Glucose-Capillary: 103 mg/dL — ABNORMAL HIGH (ref 70–99)
Glucose-Capillary: 133 mg/dL — ABNORMAL HIGH (ref 70–99)

## 2020-10-04 LAB — ABO/RH: ABO/RH(D): O POS

## 2020-10-04 SURGERY — ANTERIOR CERVICAL DECOMPRESSION/DISCECTOMY FUSION 4 LEVELS
Anesthesia: General

## 2020-10-04 MED ORDER — LIDOCAINE 2% (20 MG/ML) 5 ML SYRINGE
INTRAMUSCULAR | Status: DC | PRN
Start: 2020-10-04 — End: 2020-10-04
  Administered 2020-10-04: 80 mg via INTRAVENOUS

## 2020-10-04 MED ORDER — LACTATED RINGERS IV SOLN: INTRAVENOUS | Status: DC

## 2020-10-04 MED ORDER — ALBUMIN HUMAN 5 % IV SOLN: INTRAVENOUS | Status: DC | PRN

## 2020-10-04 MED ORDER — THROMBIN 20000 UNITS EX SOLR
CUTANEOUS | Status: AC
  Filled 2020-10-04: qty 20000

## 2020-10-04 MED ORDER — PHENYLEPHRINE HCL-NACL 20-0.9 MG/250ML-% IV SOLN
INTRAVENOUS | Status: DC | PRN
  Administered 2020-10-04: 50 ug/min via INTRAVENOUS

## 2020-10-04 MED ORDER — ACETAMINOPHEN 10 MG/ML IV SOLN
1000.0000 mg | Freq: Once | INTRAVENOUS | Status: AC
  Administered 2020-10-04: 1000 mg via INTRAVENOUS

## 2020-10-04 MED ORDER — HYDROCODONE-ACETAMINOPHEN 5-325 MG PO TABS: 1.0000 | ORAL_TABLET | Freq: Four times a day (QID) | ORAL | 0 refills | Status: DC | PRN

## 2020-10-04 MED ORDER — PHENYLEPHRINE 40 MCG/ML (10ML) SYRINGE FOR IV PUSH (FOR BLOOD PRESSURE SUPPORT)
PREFILLED_SYRINGE | INTRAVENOUS | Status: DC | PRN
  Administered 2020-10-04: 80 ug via INTRAVENOUS
  Administered 2020-10-04: 40 ug via INTRAVENOUS
  Administered 2020-10-04: 80 ug via INTRAVENOUS
  Administered 2020-10-04: 40 ug via INTRAVENOUS
  Administered 2020-10-04 (×2): 80 ug via INTRAVENOUS

## 2020-10-04 MED ORDER — CEFAZOLIN SODIUM-DEXTROSE 2-4 GM/100ML-% IV SOLN
2.0000 g | INTRAVENOUS | Status: AC
  Administered 2020-10-04: 2 g via INTRAVENOUS

## 2020-10-04 MED ORDER — MIDAZOLAM HCL 2 MG/2ML IJ SOLN
INTRAMUSCULAR | Status: DC | PRN
Start: 2020-10-04 — End: 2020-10-04
  Administered 2020-10-04 (×2): 1 mg via INTRAVENOUS

## 2020-10-04 MED ORDER — DEXMEDETOMIDINE (PRECEDEX) IN NS 20 MCG/5ML (4 MCG/ML) IV SYRINGE
PREFILLED_SYRINGE | INTRAVENOUS | Status: DC | PRN
  Administered 2020-10-04: 8 ug via INTRAVENOUS

## 2020-10-04 MED ORDER — BUPIVACAINE-EPINEPHRINE (PF) 0.25% -1:200000 IJ SOLN
INTRAMUSCULAR | Status: AC
  Filled 2020-10-04: qty 30

## 2020-10-04 MED ORDER — POVIDONE-IODINE 7.5 % EX SOLN
Freq: Once | CUTANEOUS | Status: DC
  Filled 2020-10-04: qty 118

## 2020-10-04 MED ORDER — ROCURONIUM BROMIDE 10 MG/ML (PF) SYRINGE
PREFILLED_SYRINGE | INTRAVENOUS | Status: DC | PRN
  Administered 2020-10-04: 60 mg via INTRAVENOUS
  Administered 2020-10-04: 40 mg via INTRAVENOUS
  Administered 2020-10-04: 30 mg via INTRAVENOUS

## 2020-10-04 MED ORDER — CHLORHEXIDINE GLUCONATE 0.12 % MT SOLN
OROMUCOSAL | Status: AC
  Administered 2020-10-04: 15 mL via OROMUCOSAL
  Filled 2020-10-04: qty 15

## 2020-10-04 MED ORDER — DEXMEDETOMIDINE (PRECEDEX) IN NS 20 MCG/5ML (4 MCG/ML) IV SYRINGE
PREFILLED_SYRINGE | INTRAVENOUS | Status: AC
  Filled 2020-10-04: qty 5

## 2020-10-04 MED ORDER — ACETAMINOPHEN 10 MG/ML IV SOLN
INTRAVENOUS | Status: AC
  Filled 2020-10-04: qty 100

## 2020-10-04 MED ORDER — LACTATED RINGERS IV SOLN: INTRAVENOUS | Status: DC | PRN

## 2020-10-04 MED ORDER — PROPOFOL 10 MG/ML IV BOLUS
INTRAVENOUS | Status: AC
  Filled 2020-10-04: qty 20

## 2020-10-04 MED ORDER — CHLORHEXIDINE GLUCONATE 0.12 % MT SOLN: 15.0000 mL | Freq: Once | OROMUCOSAL | Status: AC

## 2020-10-04 MED ORDER — METHOCARBAMOL 500 MG PO TABS: 500.0000 mg | ORAL_TABLET | Freq: Four times a day (QID) | ORAL | 0 refills | Status: DC | PRN

## 2020-10-04 MED ORDER — ORAL CARE MOUTH RINSE
15.0000 mL | Freq: Once | OROMUCOSAL | Status: AC
Start: 2020-10-04 — End: 2020-10-04

## 2020-10-04 MED ORDER — 0.9 % SODIUM CHLORIDE (POUR BTL) OPTIME
TOPICAL | Status: DC | PRN
  Administered 2020-10-04: 1000 mL

## 2020-10-04 MED ORDER — FENTANYL CITRATE (PF) 250 MCG/5ML IJ SOLN
INTRAMUSCULAR | Status: DC | PRN
  Administered 2020-10-04 (×2): 50 ug via INTRAVENOUS
  Administered 2020-10-04: 100 ug via INTRAVENOUS
  Administered 2020-10-04: 50 ug via INTRAVENOUS

## 2020-10-04 MED ORDER — ACETAMINOPHEN 500 MG PO TABS
ORAL_TABLET | ORAL | Status: AC
  Administered 2020-10-04: 1000 mg via ORAL
  Filled 2020-10-04: qty 2

## 2020-10-04 MED ORDER — SUGAMMADEX SODIUM 200 MG/2ML IV SOLN
INTRAVENOUS | Status: DC | PRN
Start: 2020-10-04 — End: 2020-10-04
  Administered 2020-10-04: 200 mg via INTRAVENOUS

## 2020-10-04 MED ORDER — PROPOFOL 10 MG/ML IV BOLUS
INTRAVENOUS | Status: DC | PRN
  Administered 2020-10-04: 140 mg via INTRAVENOUS

## 2020-10-04 MED ORDER — FENTANYL CITRATE (PF) 100 MCG/2ML IJ SOLN
25.0000 ug | INTRAMUSCULAR | Status: DC | PRN
  Administered 2020-10-04: 50 ug via INTRAVENOUS
  Administered 2020-10-04 (×2): 25 ug via INTRAVENOUS

## 2020-10-04 MED ORDER — ACETAMINOPHEN 500 MG PO TABS: 1000.0000 mg | ORAL_TABLET | Freq: Once | ORAL | Status: AC

## 2020-10-04 MED ORDER — THROMBIN 20000 UNITS EX SOLR
CUTANEOUS | Status: DC | PRN
  Administered 2020-10-04: 20 mL via TOPICAL

## 2020-10-04 MED ORDER — CEFAZOLIN SODIUM-DEXTROSE 2-4 GM/100ML-% IV SOLN
INTRAVENOUS | Status: AC
  Filled 2020-10-04: qty 100

## 2020-10-04 MED ORDER — PROMETHAZINE HCL 25 MG/ML IJ SOLN: 6.2500 mg | INTRAMUSCULAR | Status: DC | PRN

## 2020-10-04 MED ORDER — BUPIVACAINE-EPINEPHRINE 0.25% -1:200000 IJ SOLN
INTRAMUSCULAR | Status: DC | PRN
  Administered 2020-10-04: 9 mL

## 2020-10-04 MED ORDER — ONDANSETRON HCL 4 MG/2ML IJ SOLN
INTRAMUSCULAR | Status: DC | PRN
  Administered 2020-10-04: 4 mg via INTRAVENOUS

## 2020-10-04 MED ORDER — FENTANYL CITRATE (PF) 250 MCG/5ML IJ SOLN
INTRAMUSCULAR | Status: AC
  Filled 2020-10-04: qty 5

## 2020-10-04 MED ORDER — MIDAZOLAM HCL 2 MG/2ML IJ SOLN
INTRAMUSCULAR | Status: AC
  Filled 2020-10-04: qty 2

## 2020-10-04 MED ORDER — FENTANYL CITRATE (PF) 100 MCG/2ML IJ SOLN
INTRAMUSCULAR | Status: AC
  Filled 2020-10-04: qty 2

## 2020-10-04 SURGICAL SUPPLY — 73 items
BAG COUNTER SPONGE SURGICOUNT (BAG) ×2 IMPLANT
BAG SURGICOUNT SPONGE COUNTING (BAG) ×1
BENZOIN TINCTURE PRP APPL 2/3 (GAUZE/BANDAGES/DRESSINGS) ×3 IMPLANT
BIT DRILL NEURO 2X3.1 SFT TUCH (MISCELLANEOUS) ×1 IMPLANT
BIT DRILL SRG 14X2.2XFLT CHK (BIT) ×1 IMPLANT
BIT DRL SRG 14X2.2XFLT CHK (BIT) ×1
BLADE CLIPPER SURG (BLADE) ×3 IMPLANT
BLADE SURG 15 STRL LF DISP TIS (BLADE) ×1 IMPLANT
BLADE SURG 15 STRL SS (BLADE) ×2
BONE VIVIGEN FORMABLE 5.4CC (Bone Implant) ×3 IMPLANT
BUR MATCHSTICK NEURO 3.0 LAGG (BURR) ×3 IMPLANT
CARTRIDGE OIL MAESTRO DRILL (MISCELLANEOUS) ×1 IMPLANT
CLOSURE STERI-STRIP 1/2X4 (GAUZE/BANDAGES/DRESSINGS) ×1
CLOSURE WOUND 1/2 X4 (GAUZE/BANDAGES/DRESSINGS) ×1
CLSR STERI-STRIP ANTIMIC 1/2X4 (GAUZE/BANDAGES/DRESSINGS) ×2 IMPLANT
COVER SURGICAL LIGHT HANDLE (MISCELLANEOUS) ×3 IMPLANT
DECANTER SPIKE VIAL GLASS SM (MISCELLANEOUS) ×3 IMPLANT
DIFFUSER DRILL AIR PNEUMATIC (MISCELLANEOUS) ×3 IMPLANT
DRAPE C-ARM 42X72 X-RAY (DRAPES) ×3 IMPLANT
DRAPE POUCH INSTRU U-SHP 10X18 (DRAPES) ×3 IMPLANT
DRAPE SURG 17X23 STRL (DRAPES) ×9 IMPLANT
DRILL BIT SKYLINE 14MM (BIT) ×2
DRILL NEURO 2X3.1 SOFT TOUCH (MISCELLANEOUS) ×3
DURAPREP 26ML APPLICATOR (WOUND CARE) ×3 IMPLANT
ELECT COATED BLADE 2.86 ST (ELECTRODE) ×3 IMPLANT
ELECT REM PT RETURN 9FT ADLT (ELECTROSURGICAL) ×3
ELECTRODE REM PT RTRN 9FT ADLT (ELECTROSURGICAL) ×1 IMPLANT
GAUZE 4X4 16PLY ~~LOC~~+RFID DBL (SPONGE) ×3 IMPLANT
GAUZE SPONGE 4X4 12PLY STRL (GAUZE/BANDAGES/DRESSINGS) ×3 IMPLANT
GLOVE SRG 8 PF TXTR STRL LF DI (GLOVE) ×1 IMPLANT
GLOVE SURG ENC MOIS LTX SZ6.5 (GLOVE) ×3 IMPLANT
GLOVE SURG ENC MOIS LTX SZ8 (GLOVE) ×3 IMPLANT
GLOVE SURG UNDER POLY LF SZ7 (GLOVE) ×6 IMPLANT
GLOVE SURG UNDER POLY LF SZ8 (GLOVE) ×2
GOWN STRL REUS W/ TWL LRG LVL3 (GOWN DISPOSABLE) ×1 IMPLANT
GOWN STRL REUS W/ TWL XL LVL3 (GOWN DISPOSABLE) ×1 IMPLANT
GOWN STRL REUS W/TWL LRG LVL3 (GOWN DISPOSABLE) ×2
GOWN STRL REUS W/TWL XL LVL3 (GOWN DISPOSABLE) ×2
INTERLOCK LRDTC CRVCL VBR 6MM (Bone Implant) ×2 IMPLANT
INTERLOCK LRDTC CRVCL VBR 7MM (Bone Implant) ×1 IMPLANT
INTERLOCK LRDTC CRVCL VBR 8MM (Peek) ×1 IMPLANT
IV CATH 14GX2 1/4 (CATHETERS) ×3 IMPLANT
KIT BASIN OR (CUSTOM PROCEDURE TRAY) ×3 IMPLANT
KIT TURNOVER KIT B (KITS) ×3 IMPLANT
LORDOTIC CERVICAL VBR 6MM SM (Bone Implant) ×6 IMPLANT
LORDOTIC CERVICAL VBR 7MM SM (Bone Implant) ×3 IMPLANT
LORDOTIC CERVICAL VBR 8MM SM (Peek) ×3 IMPLANT
MANIFOLD NEPTUNE II (INSTRUMENTS) ×3 IMPLANT
NEEDLE PRECISIONGLIDE 27X1.5 (NEEDLE) ×3 IMPLANT
NEEDLE SPNL 20GX3.5 QUINCKE YW (NEEDLE) ×6 IMPLANT
NS IRRIG 1000ML POUR BTL (IV SOLUTION) ×3 IMPLANT
OIL CARTRIDGE MAESTRO DRILL (MISCELLANEOUS) ×3
PACK ORTHO CERVICAL (CUSTOM PROCEDURE TRAY) ×3 IMPLANT
PAD ARMBOARD 7.5X6 YLW CONV (MISCELLANEOUS) ×6 IMPLANT
PIN DISTRACTION 14 (PIN) ×6 IMPLANT
PLATE FOUR LEVEL 68MM (Plate) ×3 IMPLANT
POSITIONER HEAD DONUT 9IN (MISCELLANEOUS) ×3 IMPLANT
SCREW SKYLINE VAR OS 14MM (Screw) ×24 IMPLANT
SCREW VAR SELF TAP SKYLINE 14M (Screw) ×6 IMPLANT
SPONGE INTESTINAL PEANUT (DISPOSABLE) ×6 IMPLANT
SPONGE SURGIFOAM ABS GEL 100 (HEMOSTASIS) ×3 IMPLANT
STRIP CLOSURE SKIN 1/2X4 (GAUZE/BANDAGES/DRESSINGS) ×2 IMPLANT
SUT MNCRL AB 4-0 PS2 18 (SUTURE) ×3 IMPLANT
SUT VIC AB 2-0 CT2 18 VCP726D (SUTURE) ×3 IMPLANT
SYR BULB IRRIG 60ML STRL (SYRINGE) ×3 IMPLANT
SYR CONTROL 10ML LL (SYRINGE) ×6 IMPLANT
TAPE CLOTH 4X10 WHT NS (GAUZE/BANDAGES/DRESSINGS) ×3 IMPLANT
TAPE UMBILICAL COTTON 1/8X30 (MISCELLANEOUS) ×3 IMPLANT
TOWEL GREEN STERILE (TOWEL DISPOSABLE) ×3 IMPLANT
TOWEL GREEN STERILE FF (TOWEL DISPOSABLE) ×3 IMPLANT
TRAY FOLEY MTR SLVR 16FR STAT (SET/KITS/TRAYS/PACK) ×3 IMPLANT
WATER STERILE IRR 1000ML POUR (IV SOLUTION) ×3 IMPLANT
YANKAUER SUCT BULB TIP NO VENT (SUCTIONS) ×3 IMPLANT

## 2020-10-04 NOTE — Transfer of Care (Signed)
Immediate Anesthesia Transfer of Care Note  Patient: Kyle Gray  Procedure(s) Performed: ANTERIOR CERVICAL DECOMPRESSION FUSION CERVICAL 3- CERVICAL 4, CERVICAL 4- CERVICAL 5, CERVICAL 5- CERVICAL 6  CERVICAL 6- CERVICAL 7 WITH INSTRUMENTATION AND ALLOGRAFT  Patient Location: PACU  Anesthesia Type:General  Level of Consciousness: awake, patient cooperative and responds to stimulation  Airway & Oxygen Therapy: Patient Spontanous Breathing and Patient connected to face mask oxygen  Post-op Assessment: Report given to RN, Post -op Vital signs reviewed and stable and Patient moving all extremities X 4  Post vital signs: Reviewed and stable  Last Vitals:  Vitals Value Taken Time  BP 155/81 10/04/20 1151  Temp    Pulse 83 10/04/20 1155  Resp 14 10/04/20 1155  SpO2 98 % 10/04/20 1155  Vitals shown include unvalidated device data.  Last Pain:  Vitals:   10/04/20 0613  TempSrc:   PainSc: 10-Worst pain ever      Patients Stated Pain Goal: 2 (10/04/20 0240)  Complications: No notable events documented.

## 2020-10-04 NOTE — Anesthesia Procedure Notes (Addendum)
Procedure Name: Intubation Date/Time: 10/04/2020 7:49 AM Performed by: Michele Rockers, CRNA Pre-anesthesia Checklist: Patient identified, Patient being monitored, Timeout performed, Emergency Drugs available and Suction available Patient Re-evaluated:Patient Re-evaluated prior to induction Oxygen Delivery Method: Circle System Utilized Preoxygenation: Pre-oxygenation with 100% oxygen Induction Type: IV induction Ventilation: Mask ventilation without difficulty Laryngoscope Size: Mac, Glidescope and 3 Grade View: Grade I Tube type: Oral Tube size: 8.0 mm Number of attempts: 1 Airway Equipment and Method: Stylet Placement Confirmation: ETT inserted through vocal cords under direct vision, positive ETCO2 and breath sounds checked- equal and bilateral Secured at: 23 cm Tube secured with: Tape Dental Injury: Teeth and Oropharynx as per pre-operative assessment

## 2020-10-04 NOTE — H&P (Signed)
PREOPERATIVE H&P  Chief Complaint: Right arm pain  HPI: Kyle Gray is a 60 y.o. male who presents with ongoing pain in the right arm  MRI reveals spinal stenosis spanning C3-C7  Patient has failed multiple forms of conservative care and continues to have pain (see office notes for additional details regarding the patient's full course of treatment)  Past Medical History:  Diagnosis Date   Acute ill-defined cerebrovascular disease 04/06/2013   Arteriosclerotic vascular disease 08/25/2019   Arthritis    Asthma    Per patient "mild"   Atopic dermatitis 06/17/2018   Benign hypertensive heart disease 06/11/2018   Benign paroxysmal positional vertigo 06/17/2018   Bilateral carotid artery stenosis 07/16/2016   CAD (coronary artery disease)    Carpal tunnel syndrome 05/14/2015   Diabetes mellitus due to underlying condition with unspecified complications (HCC) 06/17/2018   Essential hypertension 06/17/2018   History of cerebrovascular accident 08/25/2019   Hyperlipidemia 06/03/2013   Hypertension 04/06/2013   Hypokalemia 06/11/2018   Iliac artery aneurysm, bilateral (HCC) 07/16/2016   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   Mild CAD 10/18/2014   luminal irregularities on angiography March 2011 luminal irregularities on angiography March 2011   Non-cardiac chest pain 06/11/2018   Paroxysmal SVT (supraventricular tachycardia) (HCC) 10/18/2014   catheter ablation procedure for treatment of supraventricular tachycardia 2015 catheter ablation procedure for treatment of supraventricular tachycardia 2015   Peripheral vascular disease (HCC) 06/11/2018   Pharyngitis 06/17/2018   Sciatica 06/17/2018   Seasonal allergies    Shortness of breath 06/11/2018   Stroke (HCC)    Thoracic or lumbosacral neuritis or radiculitis 04/06/2013   Vertebral artery occlusion 05/14/2015   Vertigo 06/17/2018   Past Surgical History:  Procedure  Laterality Date   CARDIAC SURGERY     EYE SURGERY     HERNIA REPAIR     IR ANGIO INTRA EXTRACRAN SEL COM CAROTID INNOMINATE BILAT MOD SED  08/19/2019   IR ANGIO VERTEBRAL SEL SUBCLAVIAN INNOMINATE UNI L MOD SED  08/19/2019   IR ANGIO VERTEBRAL SEL VERTEBRAL UNI R MOD SED  08/19/2019   STENT PLACEMENT VASCULAR ([facility] HX)     Social History   Socioeconomic History   Marital status: Married    Spouse name: Not on file   Number of children: Not on file   Years of education: Not on file   Highest education level: Not on file  Occupational History   Not on file  Tobacco Use   Smoking status: Every Day    Types: Cigarettes   Smokeless tobacco: Never  Vaping Use   Vaping Use: Never used  Substance and Sexual Activity   Alcohol use: Not Currently   Drug use: Never   Sexual activity: Not on file  Other Topics Concern   Not on file  Social History Narrative   Not on file   Social Determinants of Health   Financial Resource Strain: Not on file  Food Insecurity: Not on file  Transportation Needs: Not on file  Physical Activity: Not on file  Stress: Not on file  Social Connections: Not on file   Family History  Problem Relation Age of Onset   Heart attack Mother    Stroke Father    Stroke Maternal Grandfather    No Known Allergies Prior to Admission medications   Medication Sig Start Date End Date Taking? Authorizing Provider  albuterol (VENTOLIN HFA) 108 (90 Base) MCG/ACT inhaler Inhale 1-2 puffs into  the lungs every 6 (six) hours as needed for wheezing or shortness of breath.   Yes [provider]  Alirocumab (PRALUENT) 150 MG/ML SOAJ Inject 150 mg into the skin every 14 (fourteen) days.   Yes [provider]  amLODipine (NORVASC) 10 MG tablet Take 10 mg by mouth daily. 04/16/18  Yes [provider]  ASPIRIN 81 PO Take 81 mg by mouth daily.  09/26/15  Yes [provider]  clopidogrel (PLAVIX) 75 MG tablet Take 75 mg by mouth daily. 03/22/18   Yes [provider]  empagliflozin (JARDIANCE) 10 MG TABS tablet Take 10 mg by mouth daily.   Yes [provider]  folic acid (FOLVITE) 1 MG tablet Take 1 mg by mouth daily.   Yes [provider]  glimepiride (AMARYL) 1 MG tablet Take 1 mg by mouth daily. 09/19/20  Yes [provider]  latanoprost (XALATAN) 0.005 % ophthalmic solution Place 1 drop into both eyes at bedtime.   Yes [provider]  losartan-hydrochlorothiazide (HYZAAR) 100-12.5 MG tablet Take 1 tablet by mouth daily.   Yes [provider]  metaxalone (SKELAXIN) 800 MG tablet Take 800 mg by mouth every 8 (eight) hours as needed. 07/19/20  Yes [provider]  metFORMIN (GLUCOPHAGE) 1000 MG tablet Take 1,000 mg by mouth 2 (two) times daily with a meal. 06/03/15  Yes [provider]  metoprolol succinate (TOPROL-XL) 50 MG 24 hr tablet Take 50 mg by mouth daily. 05/30/18  Yes [provider]  omeprazole (PRILOSEC) 20 MG capsule Take 20 mg by mouth daily as needed for heartburn. 04/12/20  Yes [provider]  pregabalin (LYRICA) 75 MG capsule Take 75 mg by mouth daily as needed (Nerve pain). 08/14/20  Yes [provider]  cetirizine (ZYRTEC) 10 MG tablet Take 10 mg by mouth daily as needed for allergies. 04/17/18   [provider]  rosuvastatin (CRESTOR) 40 MG tablet Take 40 mg by mouth daily.    [provider]  tizanidine (ZANAFLEX) 2 MG capsule Take 4 mg by mouth 3 (three) times daily as needed for pain. 07/03/20   [provider]  traMADol (ULTRAM) 50 MG tablet Take 50 mg by mouth every 6 (six) hours as needed for pain. 08/14/20   [provider]     All other systems have been reviewed and were otherwise negative with the exception of those mentioned in the HPI and as above.  Physical Exam: Vitals:   10/04/20 0556  BP: 130/75  Pulse: 73  Resp: 18  Temp: 98.2 F (36.8 C)  SpO2: 97%    Body mass  index is 31.78 kg/m.  General: Alert, no acute distress Cardiovascular: No pedal edema Respiratory: No cyanosis, no use of accessory musculature Skin: No lesions in the area of chief complaint Neurologic: Sensation intact distally Psychiatric: Patient is competent for consent with normal mood and affect Lymphatic: No axillary or cervical lymphadenopathy   Assessment/Plan: RIGHT SIDED CERVICAL RADICULOPATHY Plan for Procedure(s): ANTERIOR CERVICAL DECOMPRESSION FUSION CERVICAL 3- CERVICAL 4, CERVICAL 4- CERVICAL 5, CERVICAL 5- CERVICAL 6  CERVICAL 6- CERVICAL 7 WITH INSTRUMENTATION AND ALLOGRAFT   Jackelyn Hoehn, MD 10/04/2020 6:43 AM

## 2020-10-04 NOTE — Anesthesia Postprocedure Evaluation (Signed)
Anesthesia Post Note  Patient: Kyle Gray  Procedure(s) Performed: ANTERIOR CERVICAL DECOMPRESSION FUSION CERVICAL 3- CERVICAL 4, CERVICAL 4- CERVICAL 5, CERVICAL 5- CERVICAL 6  CERVICAL 6- CERVICAL 7 WITH INSTRUMENTATION AND ALLOGRAFT     Patient location during evaluation: PACU Anesthesia Type: General Level of consciousness: awake and alert and awake Pain management: pain level controlled Vital Signs Assessment: post-procedure vital signs reviewed and stable Respiratory status: spontaneous breathing, nonlabored ventilation and respiratory function stable Cardiovascular status: blood pressure returned to baseline and stable Postop Assessment: no apparent nausea or vomiting Anesthetic complications: no   No notable events documented.  Last Vitals:  Vitals:   10/04/20 1350 10/04/20 1400  BP: (!) 166/95   Pulse: 82   Resp: 20   Temp:  36.5 C  SpO2: 100%     Last Pain:  Vitals:   10/04/20 1400  TempSrc:   PainSc: 8                  Cecile Hearing

## 2020-10-04 NOTE — Op Note (Signed)
PATIENT NAME: Kyle Gray   MEDICAL RECORD NO.:   [id number]    DATE OF BIRTH: 1960-03-22   DATE OF PROCEDURE: 10/04/2020                               OPERATIVE REPORT     PREOPERATIVE DIAGNOSES: 1. Right-sided cervical radiculopathy. 2. Spinal stenosis spanning C3-C7.   POSTOPERATIVE DIAGNOSES: 1. Right-sided cervical radiculopathy. 2. Spinal stenosis spanning C3-C7.   PROCEDURE: 1. Anterior cervical decompression and fusion C3/4, C4/5, C5/6, C6/7. 2. Placement of anterior instrumentation, C3-C7. 3. Insertion of interbody device x 4 (Titan intervertebral spacers). 4. Intraoperative use of fluoroscopy. 5. Use of morselized allograft - ViviGen.   SURGEON:  Estill Bamberg, MD   ASSISTANT:  Jason Coop, PA-C.   ANESTHESIA:  General endotracheal anesthesia.   COMPLICATIONS:  None.   DISPOSITION:  Stable.   ESTIMATED BLOOD LOSS:  Minimal.   INDICATIONS FOR SURGERY:  Briefly, Kyle Gray is a pleasant 60 y.o. year- old patient, who did present to me with severe pain in the neck and right arm.  The patient's MRI did reveal the findings noted above.  Given the patient's ongoing rather debilitating pain and lack of improvement with appropriate treatment measures, we did discuss proceeding with the procedure noted above.  The patient was fully aware of the risks and limitations of surgery as outlined in my preoperative note.   OPERATIVE DETAILS:  On 10/04/2020, the patient was brought to surgery and general endotracheal anesthesia was administered.  The patient was placed supine on the hospital bed. The neck was gently extended.  All bony prominences were meticulously padded.  The neck was prepped and draped in the usual sterile fashion.  At this point, I did make a left-sided oblique incision, in line with the sternocleidomastoid muscle, just anterior to.   The platysma was incised.  A Smith-Robinson approach was used and the anterior spine was identified. A self-retaining  retractor was placed.  I then subperiosteally exposed the vertebral bodies from C3-C7.  Caspar pins were then placed into the C6 and C7 vertebral bodies and distraction was applied.  A thorough and complete C6-7 intervertebral diskectomy was performed.  The posterior longitudinal ligament was identified and entered using a nerve hook.  I then used #1 followed by #2 Kerrison to perform a thorough and complete intervertebral diskectomy.  The spinal canal was thoroughly decompressed, as was the right and left neuroforamen.  The endplates were then prepared and the appropriate-sized intervertebral spacer was then packed with ViviGen and tamped into position in the usual fashion.  The lower Caspar pin was then removed and placed into the C5 vertebral body and once again, distraction was applied across the C5-6 intervertebral space.  I then again performed a thorough and complete diskectomy, thoroughly decompressing the spinal canal and bilateral neuroforamena.  After preparing the endplates, the appropriate-sized intervertebral spacer was packed with ViviGen and tamped into position.  The lower Caspar pin was then removed and placed into the C4 vertebral body and once again, distraction was applied across the C4-5 intervertebral space.  I then again performed a thorough and complete diskectomy, thoroughly decompressing the spinal canal and bilateral neuroforamena.  After preparing the endplates, the appropriate-sized intervertebral spacer was packed with ViviGen and tamped into position. The lower Caspar pin was then removed and placed into the C3 vertebral body and once again, distraction was applied across the C3-4 intervertebral space.  I  then again performed a thorough and complete diskectomy, thoroughly decompressing the spinal canal and bilateral neuroforamena.  After preparing the endplates, the appropriate-sized intervertebral spacer was packed with ViviGen and tamped into position.     The Caspar pins then were removed and bone wax was placed in their place.  The appropriate-sized anterior cervical plate was placed over the anterior spine.  14 mm variable angle screws were placed, 2 in each vertebral body from C3-C7 for a total of 9 vertebral body screws.  I did elect to forego placement of the right C7 vertebral body screw, given the very high angle that would be required to safely place the screw.  I was very pleased with the purchase of each of the vertebral body screws that were placed. The screws were then locked to the plate using the Cam locking mechanism.  I was very pleased with the final fluoroscopic images.  The wound was then irrigated.  The wound was then explored for any undue bleeding and there was no bleeding noted. The wound was then closed in layers using 2-0 Vicryl, followed by 4-0 Monocryl.  Benzoin and Steri-Strips were applied, followed by sterile dressing.  All instrument counts were correct at the termination of the procedure.   Of note, Jason Coop, PA-C, was my assistant throughout surgery, and did aid in retraction, suctioning, placement of the hardware, and closure from start to finish.         Estill Bamberg, MD

## 2020-10-07 ENCOUNTER — Encounter (HOSPITAL_COMMUNITY): Payer: Self-pay | Admitting: Orthopedic Surgery

## 2020-10-08 ENCOUNTER — Emergency Department (HOSPITAL_COMMUNITY): Payer: BC Managed Care – PPO

## 2020-10-08 ENCOUNTER — Observation Stay (HOSPITAL_COMMUNITY)
Admission: EM | Admit: 2020-10-08 | Discharge: 2020-10-09 | Disposition: A | Discharge status: Home / Self Care | Payer: BC Managed Care – PPO | Attending: Emergency Medicine | Admitting: Emergency Medicine

## 2020-10-08 ENCOUNTER — Other Ambulatory Visit: Payer: Self-pay

## 2020-10-08 DIAGNOSIS — Z981 Arthrodesis status: Secondary | ICD-10-CM | POA: Insufficient documentation

## 2020-10-08 DIAGNOSIS — I2510 Atherosclerotic heart disease of native coronary artery without angina pectoris: Secondary | ICD-10-CM | POA: Insufficient documentation

## 2020-10-08 DIAGNOSIS — R1310 Dysphagia, unspecified: Secondary | ICD-10-CM

## 2020-10-08 DIAGNOSIS — Z79899 Other long term (current) drug therapy: Secondary | ICD-10-CM | POA: Diagnosis not present

## 2020-10-08 DIAGNOSIS — J45909 Unspecified asthma, uncomplicated: Secondary | ICD-10-CM | POA: Insufficient documentation

## 2020-10-08 DIAGNOSIS — M542 Cervicalgia: Secondary | ICD-10-CM

## 2020-10-08 DIAGNOSIS — Z20822 Contact with and (suspected) exposure to covid-19: Secondary | ICD-10-CM | POA: Diagnosis not present

## 2020-10-08 DIAGNOSIS — Z7984 Long term (current) use of oral hypoglycemic drugs: Secondary | ICD-10-CM | POA: Insufficient documentation

## 2020-10-08 DIAGNOSIS — Z9889 Other specified postprocedural states: Secondary | ICD-10-CM

## 2020-10-08 DIAGNOSIS — F17210 Nicotine dependence, cigarettes, uncomplicated: Secondary | ICD-10-CM | POA: Insufficient documentation

## 2020-10-08 DIAGNOSIS — I10 Essential (primary) hypertension: Secondary | ICD-10-CM | POA: Insufficient documentation

## 2020-10-08 DIAGNOSIS — J029 Acute pharyngitis, unspecified: Secondary | ICD-10-CM | POA: Diagnosis not present

## 2020-10-08 DIAGNOSIS — E119 Type 2 diabetes mellitus without complications: Secondary | ICD-10-CM | POA: Diagnosis not present

## 2020-10-08 HISTORY — DX: Cervicalgia: M54.2

## 2020-10-08 HISTORY — DX: Other specified postprocedural states: Z98.890

## 2020-10-08 LAB — CBC WITH DIFFERENTIAL/PLATELET
Abs Immature Granulocytes: 0.04 10*3/uL (ref 0.00–0.07)
Basophils Absolute: 0 10*3/uL (ref 0.0–0.1)
Basophils Relative: 1 %
Eosinophils Absolute: 0.2 10*3/uL (ref 0.0–0.5)
Eosinophils Relative: 4 %
HCT: 43.6 % (ref 39.0–52.0)
Hemoglobin: 14 g/dL (ref 13.0–17.0)
Immature Granulocytes: 1 %
Lymphocytes Relative: 25 %
Lymphs Abs: 1.4 10*3/uL (ref 0.7–4.0)
MCH: 29.9 pg (ref 26.0–34.0)
MCHC: 32.1 g/dL (ref 30.0–36.0)
MCV: 93.2 fL (ref 80.0–100.0)
Monocytes Absolute: 0.5 10*3/uL (ref 0.1–1.0)
Monocytes Relative: 9 %
Neutro Abs: 3.3 10*3/uL (ref 1.7–7.7)
Neutrophils Relative %: 60 %
Platelets: 208 10*3/uL (ref 150–400)
RBC: 4.68 MIL/uL (ref 4.22–5.81)
RDW: 12.5 % (ref 11.5–15.5)
WBC: 5.5 10*3/uL (ref 4.0–10.5)
nRBC: 0 % (ref 0.0–0.2)

## 2020-10-08 LAB — COMPREHENSIVE METABOLIC PANEL
ALT: 21 U/L (ref 0–44)
AST: 28 U/L (ref 15–41)
Albumin: 3.7 g/dL (ref 3.5–5.0)
Alkaline Phosphatase: 87 U/L (ref 38–126)
Anion gap: 15 (ref 5–15)
BUN: 13 mg/dL (ref 8–23)
CO2: 18 mmol/L — ABNORMAL LOW (ref 22–32)
Calcium: 10.1 mg/dL (ref 8.9–10.3)
Chloride: 106 mmol/L (ref 98–111)
Creatinine, Ser: 1.03 mg/dL (ref 0.61–1.24)
GFR, Estimated: >60 mL/min (ref >60)
Glucose, Bld: 105 mg/dL — ABNORMAL HIGH (ref 70–99)
Potassium: 3.8 mmol/L (ref 3.5–5.1)
Sodium: 139 mmol/L (ref 135–145)
Total Bilirubin: 2.9 mg/dL — ABNORMAL HIGH (ref 0.3–1.2)
Total Protein: 7.4 g/dL (ref 6.5–8.1)

## 2020-10-08 LAB — GROUP A STREP BY PCR: Group A Strep by PCR: NOT DETECTED

## 2020-10-08 LAB — GLUCOSE, CAPILLARY
Glucose-Capillary: 128 mg/dL — ABNORMAL HIGH (ref 70–99)
Glucose-Capillary: 124 mg/dL — ABNORMAL HIGH (ref 70–99)

## 2020-10-08 LAB — RESP PANEL BY RT-PCR (FLU A&B, COVID) ARPGX2
Influenza A by PCR: NEGATIVE
Influenza B by PCR: NEGATIVE
SARS Coronavirus 2 by RT PCR: NEGATIVE

## 2020-10-08 LAB — LACTIC ACID, PLASMA: Lactic Acid, Venous: 1.2 mmol/L (ref 0.5–1.9)

## 2020-10-08 MED ORDER — INSULIN ASPART 100 UNIT/ML IJ SOLN
0.0000 [IU] | Freq: Three times a day (TID) | INTRAMUSCULAR | Status: DC
Start: 2020-10-08 — End: 2020-10-09
  Administered 2020-10-08: 2 [IU] via SUBCUTANEOUS
  Administered 2020-10-09 (×2): 3 [IU] via SUBCUTANEOUS

## 2020-10-08 MED ORDER — DEXAMETHASONE SODIUM PHOSPHATE 10 MG/ML IJ SOLN
10.0000 mg | Freq: Three times a day (TID) | INTRAMUSCULAR | Status: AC
  Administered 2020-10-08 – 2020-10-09 (×3): 10 mg via INTRAVENOUS
  Filled 2020-10-08 (×3): qty 1

## 2020-10-08 MED ORDER — SODIUM CHLORIDE 0.9 % IV BOLUS
1000.0000 mL | Freq: Once | INTRAVENOUS | Status: AC
  Administered 2020-10-08: 1000 mL via INTRAVENOUS

## 2020-10-08 MED ORDER — METHOCARBAMOL 500 MG PO TABS
500.0000 mg | ORAL_TABLET | Freq: Four times a day (QID) | ORAL | Status: DC | PRN
  Administered 2020-10-08: 500 mg via ORAL
  Filled 2020-10-08: qty 1

## 2020-10-08 MED ORDER — ONDANSETRON HCL 4 MG/2ML IJ SOLN
4.0000 mg | Freq: Once | INTRAMUSCULAR | Status: AC
  Administered 2020-10-08: 4 mg via INTRAVENOUS
  Filled 2020-10-08: qty 2

## 2020-10-08 MED ORDER — IOHEXOL 350 MG/ML SOLN
65.0000 mL | Freq: Once | INTRAVENOUS | Status: AC | PRN
  Administered 2020-10-08: 65 mL via INTRAVENOUS

## 2020-10-08 MED ORDER — HYDROCODONE-ACETAMINOPHEN 5-325 MG PO TABS
1.0000 | ORAL_TABLET | ORAL | Status: DC | PRN
  Administered 2020-10-08: 2 via ORAL
  Administered 2020-10-08: 1 via ORAL
  Administered 2020-10-09: 2 via ORAL
  Filled 2020-10-08 (×2): qty 2
  Filled 2020-10-08: qty 1

## 2020-10-08 MED ORDER — HYDROMORPHONE HCL 1 MG/ML IJ SOLN
1.0000 mg | Freq: Once | INTRAMUSCULAR | Status: AC
  Administered 2020-10-08: 1 mg via INTRAVENOUS
  Filled 2020-10-08: qty 1

## 2020-10-08 MED ORDER — ONDANSETRON HCL 4 MG/2ML IJ SOLN: 4.0000 mg | Freq: Four times a day (QID) | INTRAMUSCULAR | Status: DC | PRN

## 2020-10-08 MED ORDER — SENNOSIDES-DOCUSATE SODIUM 8.6-50 MG PO TABS: 1.0000 | ORAL_TABLET | Freq: Every evening | ORAL | Status: DC | PRN

## 2020-10-08 MED ORDER — METHOCARBAMOL 1000 MG/10ML IJ SOLN
500.0000 mg | Freq: Four times a day (QID) | INTRAVENOUS | Status: DC | PRN
  Filled 2020-10-08: qty 5

## 2020-10-08 MED ORDER — MORPHINE SULFATE (PF) 2 MG/ML IV SOLN
0.5000 mg | INTRAVENOUS | Status: DC | PRN
Start: 2020-10-08 — End: 2020-10-09
  Administered 2020-10-09 (×2): 1 mg via INTRAVENOUS
  Filled 2020-10-08 (×2): qty 1

## 2020-10-08 MED ORDER — LACTATED RINGERS IV SOLN: INTRAVENOUS | Status: DC

## 2020-10-08 MED ORDER — ACETAMINOPHEN 325 MG PO TABS: 325.0000 mg | ORAL_TABLET | Freq: Four times a day (QID) | ORAL | Status: DC | PRN

## 2020-10-08 MED ORDER — ONDANSETRON HCL 4 MG PO TABS: 4.0000 mg | ORAL_TABLET | Freq: Four times a day (QID) | ORAL | Status: DC | PRN

## 2020-10-08 NOTE — ED Provider Notes (Signed)
[facility] EMERGENCY DEPARTMENT Provider Note   CSN: [id number] Arrival date & time: 10/08/20  9528     History Chief Complaint  Patient presents with   Oral Swelling    Kyle Gray is a 60 y.o. male.  Kyle Gray is status post anterior cervical decompression and fusion of C3-C7 on October 04, 2020.  States that he has had pain with swallowing since October 05, 2020.  This has progressed to the point where he is having difficulty even swallowing his own saliva.  He states that he has tried popsicles and water, but he cannot tolerate them.  He may or may not have a pill stuck in his throat.  He was somewhat unclear as to whether swallowing the pill started the symptoms or were subsequent to developing the sore throat.  The history is provided by the patient.  Sore Throat This is a new problem. Episode onset: 4 days ago. The problem occurs constantly. The problem has been gradually worsening. Pertinent negatives include no chest pain, no abdominal pain, no headaches and no shortness of breath. The symptoms are aggravated by swallowing. Nothing relieves the symptoms. He has tried water for the symptoms. The treatment provided no relief.      Past Medical History:  Diagnosis Date   Acute ill-defined cerebrovascular disease 04/06/2013   Arteriosclerotic vascular disease 08/25/2019   Arthritis    Asthma    Per patient "mild"   Atopic dermatitis 06/17/2018   Benign hypertensive heart disease 06/11/2018   Benign paroxysmal positional vertigo 06/17/2018   Bilateral carotid artery stenosis 07/16/2016   CAD (coronary artery disease)    Carpal tunnel syndrome 05/14/2015   Diabetes mellitus due to underlying condition with unspecified complications (HCC) 06/17/2018   Essential hypertension 06/17/2018   History of cerebrovascular accident 08/25/2019   Hyperlipidemia 06/03/2013   Hypertension 04/06/2013   Hypokalemia 06/11/2018   Iliac artery aneurysm,  bilateral (HCC) 07/16/2016   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   Mild CAD 10/18/2014   luminal irregularities on angiography March 2011 luminal irregularities on angiography March 2011   Non-cardiac chest pain 06/11/2018   Paroxysmal SVT (supraventricular tachycardia) (HCC) 10/18/2014   catheter ablation procedure for treatment of supraventricular tachycardia 2015 catheter ablation procedure for treatment of supraventricular tachycardia 2015   Peripheral vascular disease (HCC) 06/11/2018   Pharyngitis 06/17/2018   Sciatica 06/17/2018   Seasonal allergies    Shortness of breath 06/11/2018   Stroke (HCC)    Thoracic or lumbosacral neuritis or radiculitis 04/06/2013   Vertebral artery occlusion 05/14/2015   Vertigo 06/17/2018    Patient Active Problem List   Diagnosis Date Noted   CAD (coronary artery disease)    Stroke (HCC)    Seasonal allergies    Arteriosclerotic vascular disease 08/25/2019   History of cerebrovascular accident 08/25/2019   Atopic dermatitis 06/17/2018   Benign paroxysmal positional vertigo 06/17/2018   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   Pharyngitis 06/17/2018   Sciatica 06/17/2018   Vertigo 06/17/2018   Essential hypertension 06/17/2018   Chest discomfort 06/17/2018   Diabetes mellitus due to underlying condition with unspecified complications (HCC) 06/17/2018   Diabetes mellitus, new onset (HCC) 06/11/2018   Benign hypertensive heart disease 06/11/2018   Coughing 06/11/2018   Hypokalemia 06/11/2018   Non-cardiac chest pain 06/11/2018   Peripheral vascular disease (HCC) 06/11/2018   Shortness of breath 06/11/2018   Bilateral carotid artery stenosis 07/16/2016   Iliac artery aneurysm, bilateral (HCC) 07/16/2016  Carpal tunnel syndrome 05/14/2015   Low back pain 05/14/2015   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   Neck pain 05/14/2015   Vertebral artery occlusion 05/14/2015   Mild CAD  10/18/2014   Paroxysmal SVT (supraventricular tachycardia) (HCC) 10/18/2014   Hyperlipidemia 06/03/2013   Acute ill-defined cerebrovascular disease 04/06/2013   Hypertension 04/06/2013   Thoracic or lumbosacral neuritis or radiculitis 04/06/2013    Past Surgical History:  Procedure Laterality Date   ANTERIOR CERVICAL DECOMPRESSION/DISCECTOMY FUSION 4 LEVELS N/A 10/04/2020   Procedure: ANTERIOR CERVICAL DECOMPRESSION FUSION CERVICAL 3- CERVICAL 4, CERVICAL 4- CERVICAL 5, CERVICAL 5- CERVICAL 6  CERVICAL 6- CERVICAL 7 WITH INSTRUMENTATION AND ALLOGRAFT;  Surgeon: Estill Bamberg, MD;  Location: [facility] OR;  Service: Orthopedics;  Laterality: N/A;   CARDIAC SURGERY     EYE SURGERY     HERNIA REPAIR     IR ANGIO INTRA EXTRACRAN SEL COM CAROTID INNOMINATE BILAT MOD SED  08/19/2019   IR ANGIO VERTEBRAL SEL SUBCLAVIAN INNOMINATE UNI L MOD SED  08/19/2019   IR ANGIO VERTEBRAL SEL VERTEBRAL UNI R MOD SED  08/19/2019   STENT PLACEMENT VASCULAR ([facility] HX)         Family History  Problem Relation Age of Onset   Heart attack Mother    Stroke Father    Stroke Maternal Grandfather     Social History   Tobacco Use   Smoking status: Every Day    Types: Cigarettes   Smokeless tobacco: Never  Vaping Use   Vaping Use: Never used  Substance Use Topics   Alcohol use: Not Currently   Drug use: Never    Home Medications Prior to Admission medications   Medication Sig Start Date End Date Taking? Authorizing Provider  albuterol (VENTOLIN HFA) 108 (90 Base) MCG/ACT inhaler Inhale 1-2 puffs into the lungs every 6 (six) hours as needed for wheezing or shortness of breath.    [provider]  Alirocumab (PRALUENT) 150 MG/ML SOAJ Inject 150 mg into the skin every 14 (fourteen) days.    [provider]  amLODipine (NORVASC) 10 MG tablet Take 10 mg by mouth daily. 04/16/18   [provider]  cetirizine (ZYRTEC) 10 MG tablet Take 10 mg by mouth daily as needed for allergies. 04/17/18    [provider]  empagliflozin (JARDIANCE) 10 MG TABS tablet Take 10 mg by mouth daily.    [provider]  folic acid (FOLVITE) 1 MG tablet Take 1 mg by mouth daily.    [provider]  glimepiride (AMARYL) 1 MG tablet Take 1 mg by mouth daily. 09/19/20   [provider]  HYDROcodone-acetaminophen (NORCO/VICODIN) 5-325 MG tablet Take 1 tablet by mouth every 6 (six) hours as needed for moderate pain or severe pain. 10/04/20 10/04/21  McKenzie, Eilene Ghazi, PA-C  latanoprost (XALATAN) 0.005 % ophthalmic solution Place 1 drop into both eyes at bedtime.    [provider]  losartan-hydrochlorothiazide (HYZAAR) 100-12.5 MG tablet Take 1 tablet by mouth daily.    [provider]  metaxalone (SKELAXIN) 800 MG tablet Take 800 mg by mouth every 8 (eight) hours as needed. 07/19/20   [provider]  metFORMIN (GLUCOPHAGE) 1000 MG tablet Take 1,000 mg by mouth 2 (two) times daily with a meal. 06/03/15   [provider]  methocarbamol (ROBAXIN) 500 MG tablet Take 1 tablet (500 mg total) by mouth every 6 (six) hours as needed for muscle spasms. 10/04/20   McKenzie, Eilene Ghazi, PA-C  metoprolol succinate (  TOPROL-XL) 50 MG 24 hr tablet Take 50 mg by mouth daily. 05/30/18   [provider]  omeprazole (PRILOSEC) 20 MG capsule Take 20 mg by mouth daily as needed for heartburn. 04/12/20   [provider]  pregabalin (LYRICA) 75 MG capsule Take 75 mg by mouth daily as needed (Nerve pain). 08/14/20   [provider]  rosuvastatin (CRESTOR) 40 MG tablet Take 40 mg by mouth daily.    [provider]  traMADol (ULTRAM) 50 MG tablet Take 50 mg by mouth every 6 (six) hours as needed for pain. 08/14/20   [provider]    Allergies    Patient has no known allergies.  Review of Systems   Review of Systems  Constitutional:  Positive for diaphoresis. Negative for chills and fever.  HENT:  Positive for sore throat. Negative  for ear pain.   Eyes:  Negative for pain and visual disturbance.  Respiratory:  Negative for cough and shortness of breath.   Cardiovascular:  Negative for chest pain and palpitations.  Gastrointestinal:  Negative for abdominal pain and vomiting.  Genitourinary:  Negative for dysuria and hematuria.  Musculoskeletal:  Negative for arthralgias and back pain.  Skin:  Negative for color change and rash.  Neurological:  Negative for seizures, syncope and headaches.  All other systems reviewed and are negative.  Physical Exam Updated Vital Signs BP (!) 152/104 (BP Location: Right Arm)   Pulse (!) 103   Temp 97.7 F (36.5 C) (Oral)   Resp 18   Ht 5\' 8"  (1.727 m)   Wt 94.8 kg   SpO2 100%   BMI 31.78 kg/m   Physical Exam Vitals and nursing note reviewed.  Constitutional:      Appearance: Normal appearance. He is ill-appearing.  HENT:     Head: Normocephalic and atraumatic.     Nose: Nose normal.     Mouth/Throat:     Mouth: Mucous membranes are moist.     Palate: No mass and lesions.     Pharynx: Uvula midline. Posterior oropharyngeal erythema present.     Comments: Patient is able to speak.  However, he is frequently spitting small amounts of saliva and appears to have difficulty swallowing. Eyes:     General: Lids are normal.     Conjunctiva/sclera: Conjunctivae normal.  Neck:     Comments: Anterior cervical incision is clean, dry, and intact and covered with Steri-Strips.  Mild swelling of the right lateral neck without frank fluctuance.  No pulsatile mass.  Minimal tenderness to palpation in this area Cardiovascular:     Rate and Rhythm: Regular rhythm. Tachycardia present.     Heart sounds: Normal heart sounds.  Pulmonary:     Effort: Pulmonary effort is normal.     Breath sounds: Normal breath sounds.  Musculoskeletal:     Cervical back: Normal range of motion.     Right lower leg: No edema.     Left lower leg: No edema.  Skin:    General: Skin is warm and dry.   Neurological:     General: No focal deficit present.     Mental Status: He is alert and oriented to person, place, and time.  Psychiatric:        Mood and Affect: Mood normal.        Behavior: Behavior normal.    ED Results / Procedures / Treatments   Labs (all labs ordered are listed, but only abnormal results are displayed) Labs Reviewed  COMPREHENSIVE  METABOLIC PANEL - Abnormal; Notable for the following components:      Result Value   CO2 18 (*)    Glucose, Bld 105 (*)    Total Bilirubin 2.9 (*)    All other components within normal limits  BASIC METABOLIC PANEL - Abnormal; Notable for the following components:   CO2 20 (*)    Glucose, Bld 136 (*)    All other components within normal limits  GLUCOSE, CAPILLARY - Abnormal; Notable for the following components:   Glucose-Capillary 128 (*)    All other components within normal limits  GLUCOSE, CAPILLARY - Abnormal; Notable for the following components:   Glucose-Capillary 124 (*)    All other components within normal limits  GLUCOSE, CAPILLARY - Abnormal; Notable for the following components:   Glucose-Capillary 187 (*)    All other components within normal limits  CULTURE, BLOOD (SINGLE)  RESP PANEL BY RT-PCR (FLU A&B, COVID) ARPGX2  GROUP A STREP BY PCR  LACTIC ACID, PLASMA  CBC WITH DIFFERENTIAL/PLATELET    EKG None  Radiology CT Soft Tissue Neck W Contrast  Result Date: 10/08/2020 CLINICAL DATA:  Neck abscess, deep tissue. Additional history provided: Throat swelling, unable to swallow, sore. Patient unable to eat since 10/05/2020. History of cervical fusion. EXAM: CT NECK WITH CONTRAST TECHNIQUE: Multidetector CT imaging of the neck was performed using the standard protocol following the bolus administration of intravenous contrast. CONTRAST:  51mL OMNIPAQUE IOHEXOL 350 MG/ML SOLN COMPARISON:  Intraoperative fluoroscopic images from ACDF 10/04/2020. neck CT 08/06/2013. FINDINGS: Pharynx and larynx: There is  prominent prevertebral/retropharyngeal soft tissue thickening and edema spanning the C2-C5 levels measuring up to 2.4 cm in AP dimension. There is no peripherally enhancing retropharyngeal collection at this time to suggest retropharyngeal abscess. There is resultant moderate effacement of the aerodigestive tract at the level of the supraglottic larynx/hypopharynx. No appreciable swelling or discrete mass elsewhere within the oral cavity, pharynx or larynx. Salivary glands: No inflammation, mass, or stone. Thyroid: Unremarkable. Lymph nodes: No pathologically enlarged cervical chain lymph nodes. Vascular: The major vascular structures of the neck are patent. Atherosclerotic plaque within the visualized aortic arch, proximal major branch vessels of the neck as well as internal carotid arteries and vertebral arteries. Limited intracranial: No evidence of acute intracranial abnormality within the field of view. Visualized orbits: Largely excluded from the field-of-view. No orbital mass or acute finding at the imaged levels. Mastoids and visualized paranasal sinuses: Small mucous retention cyst within the bilateral maxillary sinuses. Small volume fluid within the left sphenoid sinus. No significant mastoid effusion. Skeleton: Sequela of recent C3-C7 ACDF. Superimposed cervical spondylosis. No acute bony abnormality or aggressive osseous lesion. Upper chest: No consolidation within the imaged lung apices. Other: Edema within the left-greater-than-right neck, as well as small foci of gas within the left neck soft tissues, likely related to the recent surgery. IMPRESSION: Sequela of recent C3-C7 ACDF. Prominent prevertebral/retropharyngeal soft tissue thickening and edema spanning the C2-C5 levels measuring up to 2.4 cm in AP dimension. This may reflect soft tissue swelling/edema related to the recent surgery. However, correlate clinically to exclude any signs or symptoms of infection. There is no peripherally enhancing  retropharyngeal collection at this time to suggest retropharyngeal abscess. Resultant moderate effacement of the aerodigestive tract at the level of the supraglottic larynx/hypopharynx. Additional edema within the left-greater-than-right neck, as well as small foci of gas within the left neck soft tissues, likely related to the recent surgery. Electronically Signed   By: Jackey Loge D.O.  On: 10/08/2020 12:50   DG Chest Port 1 View  Result Date: 10/08/2020 CLINICAL DATA:  Questionable sepsis. EXAM: PORTABLE CHEST 1 VIEW COMPARISON:  One-view chest x-ray 07/16/2020 FINDINGS: Heart size is normal. Lung volumes are low. No edema or effusion is present. No focal airspace disease is present. Cervical spine surgery noted. IMPRESSION: 1. Low lung volumes. 2. No acute cardiopulmonary disease. Electronically Signed   By: Marin Robertshristopher  Mattern M.D.   On: 10/08/2020 10:15    Procedures Procedures   Medications Ordered in ED Medications  sodium chloride 0.9 % bolus 1,000 mL (has no administration in time range)  HYDROmorphone (DILAUDID) injection 1 mg (has no administration in time range)  ondansetron (ZOFRAN) injection 4 mg (has no administration in time range)    ED Course  I have reviewed the triage vital signs and the nursing notes.  Pertinent labs & imaging results that were available during my care of the patient were reviewed by me and considered in my medical decision making (see chart for details).  Clinical Course as of 10/09/20 0719  Mon Oct 08, 2020  1055 ED EKG 12-Lead [AW]  1413 I spoke with Dr. Jerl Santosalldorf regarding admission. [AW]    Clinical Course User Index [AW] Koleen DistanceWright, Kaitlynd Phillips G, MD   MDM Rules/Calculators/A&P                           Kyle Gray presented to the hospital several days after cervical spine surgery.  He was complaining of sore throat and difficulty swallowing.  He was noted to be tachycardic, and this vital sign abnormality responded to IV fluid hydration.  CT  scan revealed postoperative swelling that was effacing his trachea and esophagus.  This is likely the source of his difficulty swallowing.  White count is normal, and clinical exam does not suggest infection.  He will be admitted for further observation and IV hydration. Final Clinical Impression(s) / ED Diagnoses Final diagnoses:  Odynophagia  Neck pain with history of cervical spinal surgery    Rx / DC Orders ED Discharge Orders     None        Koleen DistanceWright, Evalena Fujii G, MD 10/09/20 [phone]

## 2020-10-08 NOTE — ED Notes (Signed)
Patient transported to CT 

## 2020-10-08 NOTE — ED Notes (Signed)
Pt back from CT

## 2020-10-08 NOTE — H&P (Addendum)
Marcene Corning, MD PA-C  Kyle Florence, PA-C                                  [facility]                [address] [place], [place]  [address]   ORTHOPAEDIC CONSULTATION  Kyle Gray            MRN:  [id number] DOB/SEX:  November 28, 1960/male     CHIEF COMPLAINT:  Painful neck and difficulty swallowing  HISTORY: Kyle Gray a 60 y.o. male with history of cervical surgery by Dr [name] 10/04/20.  Feels symptoms in his arm are much better and not having much pain.  Unfortunately has been unable to swallow even saliva for two days.   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   Pharyngitis 06/17/2018   Sciatica 06/17/2018   Vertigo 06/17/2018   Essential hypertension 06/17/2018   Chest discomfort 06/17/2018   Diabetes mellitus due to underlying condition with unspecified complications (HCC) 06/17/2018   Diabetes mellitus, new onset (HCC) 06/11/2018   Benign hypertensive heart disease 06/11/2018   Coughing 06/11/2018   Hypokalemia 06/11/2018   Non-cardiac chest pain 06/11/2018   Peripheral vascular disease (HCC) 06/11/2018   Shortness of breath 06/11/2018   Bilateral carotid artery stenosis 07/16/2016   Iliac artery aneurysm, bilateral (HCC) 07/16/2016   Carpal tunnel syndrome 05/14/2015   Low back pain 05/14/2015   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   Neck pain 05/14/2015   Vertebral artery occlusion 05/14/2015   Mild CAD 10/18/2014   Paroxysmal SVT (supraventricular tachycardia) (HCC) 10/18/2014   Hyperlipidemia 06/03/2013   Acute ill-defined  cerebrovascular disease 04/06/2013   Hypertension 04/06/2013   Thoracic or lumbosacral neuritis or radiculitis 04/06/2013   Past Medical History:  Diagnosis Date   Acute ill-defined cerebrovascular disease 04/06/2013   Arteriosclerotic vascular disease 08/25/2019   Arthritis    Asthma    Per patient "mild"   Atopic dermatitis 06/17/2018   Benign hypertensive heart disease 06/11/2018   Benign paroxysmal positional vertigo 06/17/2018   Bilateral carotid artery stenosis 07/16/2016   CAD (coronary artery disease)    Carpal tunnel syndrome 05/14/2015   Diabetes mellitus due to underlying condition with unspecified complications (HCC) 06/17/2018   Essential hypertension 06/17/2018   History of cerebrovascular accident 08/25/2019   Hyperlipidemia 06/03/2013   Hypertension 04/06/2013   Hypokalemia 06/11/2018   Iliac artery aneurysm, bilateral (HCC) 07/16/2016   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   Mild CAD 10/18/2014   luminal irregularities on angiography March 2011 luminal irregularities on angiography March 2011   Non-cardiac chest pain 06/11/2018   Paroxysmal SVT (supraventricular tachycardia) (HCC) 10/18/2014   catheter ablation procedure for treatment of supraventricular tachycardia 2015 catheter ablation procedure for treatment of supraventricular tachycardia 2015   Peripheral vascular disease (HCC) 06/11/2018   Pharyngitis 06/17/2018   Sciatica 06/17/2018   Seasonal allergies    Shortness of breath 06/11/2018   Stroke [facility])    Thoracic or lumbosacral neuritis or radiculitis  04/06/2013   Vertebral artery occlusion 05/14/2015   Vertigo 06/17/2018   Past Surgical History:  Procedure Laterality Date   ANTERIOR CERVICAL DECOMPRESSION/DISCECTOMY FUSION 4 LEVELS N/A 10/04/2020   Procedure: ANTERIOR CERVICAL DECOMPRESSION FUSION CERVICAL 3- CERVICAL 4, CERVICAL 4- CERVICAL 5, CERVICAL 5- CERVICAL 6  CERVICAL 6- CERVICAL 7 WITH  INSTRUMENTATION AND ALLOGRAFT;  Surgeon: Estill Bambergumonski, Mark, MD;  Location: [facility] OR;  Service: Orthopedics;  Laterality: N/A;   CARDIAC SURGERY     EYE SURGERY     HERNIA REPAIR     IR ANGIO INTRA EXTRACRAN SEL COM CAROTID INNOMINATE BILAT MOD SED  08/19/2019   IR ANGIO VERTEBRAL SEL SUBCLAVIAN INNOMINATE UNI L MOD SED  08/19/2019   IR ANGIO VERTEBRAL SEL VERTEBRAL UNI R MOD SED  08/19/2019   STENT PLACEMENT VASCULAR ([facility] HX)       MEDICATIONS:   Current Facility-Administered Medications:    [START ON 10/09/2020] acetaminophen (TYLENOL) tablet 325-650 mg, 325-650 mg, Oral, Q6H PRN, Nida, Andrew, PA-C   dexamethasone (DECADRON) injection 10 mg, 10 mg, Intravenous, TID, Nida, Andrew, PA-C   HYDROcodone-acetaminophen (NORCO/VICODIN) 5-325 MG per tablet 1-2 tablet, 1-2 tablet, Oral, Q4H PRN, Nida, Andrew, PA-C   insulin aspart (novoLOG) injection 0-15 Units, 0-15 Units, Subcutaneous, TID WC, Nida, Andrew, PA-C   lactated ringers infusion, , Intravenous, Continuous, Nida, Andrew, PA-C   methocarbamol (ROBAXIN) tablet 500 mg, 500 mg, Oral, Q6H PRN **OR** methocarbamol (ROBAXIN) 500 mg in dextrose 5 % 50 mL IVPB, 500 mg, Intravenous, Q6H PRN, Nida, Andrew, PA-C   morphine 2 MG/ML injection 0.5-1 mg, 0.5-1 mg, Intravenous, Q2H PRN, Nida, Andrew, PA-C   ondansetron (ZOFRAN) tablet 4 mg, 4 mg, Oral, Q6H PRN **OR** ondansetron (ZOFRAN) injection 4 mg, 4 mg, Intravenous, Q6H PRN, Nida, Andrew, PA-C   senna-docusate (Senokot-S) tablet 1 tablet, 1 tablet, Oral, QHS PRN, Kyle FlorenceNida, Andrew, PA-C  Current Outpatient Medications:    albuterol (VENTOLIN HFA) 108 (90 Base) MCG/ACT inhaler, Inhale 1-2 puffs into the lungs every 6 (six) hours as needed for wheezing or shortness of breath., Disp: , Rfl:    Alirocumab (PRALUENT) 150 MG/ML SOAJ, Inject 150 mg into the skin every 14 (fourteen) days., Disp: , Rfl:    amLODipine (NORVASC) 10 MG tablet, Take 10 mg by mouth daily., Disp: , Rfl:    cetirizine (ZYRTEC) 10 MG tablet,  Take 10 mg by mouth daily as needed for allergies., Disp: , Rfl:    empagliflozin (JARDIANCE) 10 MG TABS tablet, Take 10 mg by mouth daily., Disp: , Rfl:    folic acid (FOLVITE) 1 MG tablet, Take 1 mg by mouth daily., Disp: , Rfl:    glimepiride (AMARYL) 1 MG tablet, Take 1 mg by mouth daily., Disp: , Rfl:    HYDROcodone-acetaminophen (NORCO/VICODIN) 5-325 MG tablet, Take 1 tablet by mouth every 6 (six) hours as needed for moderate pain or severe pain., Disp: 20 tablet, Rfl: 0   latanoprost (XALATAN) 0.005 % ophthalmic solution, Place 1 drop into both eyes at bedtime., Disp: , Rfl:    losartan-hydrochlorothiazide (HYZAAR) 100-12.5 MG tablet, Take 1 tablet by mouth daily., Disp: , Rfl:    metaxalone (SKELAXIN) 800 MG tablet, Take 800 mg by mouth every 8 (eight) hours as needed., Disp: , Rfl:    metFORMIN (GLUCOPHAGE) 1000 MG tablet, Take 1,000 mg by mouth 2 (two) times daily with a meal., Disp: , Rfl:    methocarbamol (ROBAXIN) 500 MG tablet, Take 1 tablet (500 mg total) by mouth every 6 (six) hours  as needed for muscle spasms., Disp: 60 tablet, Rfl: 0   metoprolol succinate (TOPROL-XL) 50 MG 24 hr tablet, Take 50 mg by mouth daily., Disp: , Rfl:    omeprazole (PRILOSEC) 20 MG capsule, Take 20 mg by mouth daily as needed for heartburn., Disp: , Rfl:    pregabalin (LYRICA) 75 MG capsule, Take 75 mg by mouth daily as needed (Nerve pain)., Disp: , Rfl:    rosuvastatin (CRESTOR) 40 MG tablet, Take 40 mg by mouth daily., Disp: , Rfl:    traMADol (ULTRAM) 50 MG tablet, Take 50 mg by mouth every 6 (six) hours as needed for pain., Disp: , Rfl:   ALLERGIES:  No Known Allergies  REVIEW OF SYSTEMS: REVIEWED IN DETAIL IN CHART  FAMILY HISTORY:   Family History  Problem Relation Age of Onset   Heart attack Mother    Stroke Father    Stroke Maternal Grandfather     SOCIAL HISTORY:   Social History   Tobacco Use   Smoking status: Every Day    Types: Cigarettes   Smokeless tobacco: Never  Substance  Use Topics   Alcohol use: Not Currently     EXAMINATION: Vital signs in last 24 hours: Temp:  [97.7 F (36.5 C)] 97.7 F (36.5 C) (09/05 0932) Pulse Rate:  [83-103] 93 (09/05 1330) Resp:  [17-19] 17 (09/05 1330) BP: (127-152)/(75-104) 136/75 (09/05 1330) SpO2:  [97 %-100 %] 97 % (09/05 1330) Weight:  [94.8 kg] 94.8 kg (09/05 0934)  BP 136/75   Pulse 93   Temp 97.7 F (36.5 C) (Oral)   Resp 17   Ht 5\' 8"  (1.727 m)   Wt 94.8 kg   SpO2 97%   BMI 31.78 kg/m   General Appearance:    Alert, cooperative, no distress, appears stated age  Head:    Normocephalic, without obvious abnormality, atraumatic  Eyes:    PERRL, conjunctiva/corneas clear, EOM's intact, fundi    benign, both eyes       Ears:    Normal TM's and external ear canals, both ears  Nose:   Nares normal, septum midline, mucosa normal, no drainage    or sinus tenderness  Throat:   Lips, mucosa, and tongue normal; teeth and gums normal  Neck:   Supple, symmetrical, trachea midline, no adenopathy;       thyroid:  No enlargement/tenderness/nodules; no carotid   bruit or JVD. Inciision looks benign  Back:     Symmetric, no curvature, ROM normal, no CVA tenderness  Lungs:     Clear to auscultation bilaterally, respirations unlabored  Chest wall:    No tenderness or deformity  Heart:    Regular rate and rhythm, S1 and S2 normal, no murmur, rub   or gallop  Abdomen:     Soft, non-tender, bowel sounds active all four quadrants,    no masses, no organomegaly  Genitalia:    Rectal:    Extremities:   Extremities normal, atraumatic, no cyanosis or edema  Pulses:   2+ and symmetric all extremities  Skin:   Skin color, texture, turgor normal, no rashes or lesions  Lymph nodes:   Cervical, supraclavicular, and axillary nodes normal  Neurologic:   CNII-XII intact. Normal strength, sensation and reflexes      throughout    Musculoskeletal Exam:   incision benign, sensation and motor intact in arms   DIAGNOSTIC  STUDIES: Recent laboratory studies: Recent Labs    10/08/20 1024  WBC 5.5  HGB 14.0  HCT  43.6  PLT 208   Recent Labs    10/08/20 1024  NA 139  K 3.8  CL 106  CO2 18*  BUN 13  CREATININE 1.03  GLUCOSE 105*  CALCIUM 10.1   Lab Results  Component Value Date   INR 1.1 09/25/2020   INR 1.0 08/19/2019   INR 0.95 08/01/2010     Recent Radiographic Studies :  DG Cervical Spine 2 or 3 views  Result Date: 10/04/2020 CLINICAL DATA:  C3 through T1 ACDF. EXAM: CERVICAL SPINE - 2-3 VIEW; DG C-ARM 1-60 MIN-NO REPORT COMPARISON:  None. FINDINGS: Two C-arm fluoroscopic images were obtained intraoperatively and submitted for post operative interpretation. Postsurgical changes reflecting C3 through T1 ACDF are noted. The C7 and T1 vertebral bodies are not well evaluated. Otherwise, hardware alignment is grossly within expected limits. Fluoro time 15 seconds. Please see the performing provider's procedural report for further detail. IMPRESSION: Status post C3 through T1 ACDF as above. Electronically Signed   By: Lesia Hausen M.D.   On: 10/04/2020 14:10   CT Soft Tissue Neck W Contrast  Result Date: 10/08/2020 CLINICAL DATA:  Neck abscess, deep tissue. Additional history provided: Throat swelling, unable to swallow, sore. Patient unable to eat since 10/05/2020. History of cervical fusion. EXAM: CT NECK WITH CONTRAST TECHNIQUE: Multidetector CT imaging of the neck was performed using the standard protocol following the bolus administration of intravenous contrast. CONTRAST:  73mL OMNIPAQUE IOHEXOL 350 MG/ML SOLN COMPARISON:  Intraoperative fluoroscopic images from ACDF 10/04/2020. neck CT 08/06/2013. FINDINGS: Pharynx and larynx: There is prominent prevertebral/retropharyngeal soft tissue thickening and edema spanning the C2-C5 levels measuring up to 2.4 cm in AP dimension. There is no peripherally enhancing retropharyngeal collection at this time to suggest retropharyngeal abscess. There is resultant  moderate effacement of the aerodigestive tract at the level of the supraglottic larynx/hypopharynx. No appreciable swelling or discrete mass elsewhere within the oral cavity, pharynx or larynx. Salivary glands: No inflammation, mass, or stone. Thyroid: Unremarkable. Lymph nodes: No pathologically enlarged cervical chain lymph nodes. Vascular: The major vascular structures of the neck are patent. Atherosclerotic plaque within the visualized aortic arch, proximal major branch vessels of the neck as well as internal carotid arteries and vertebral arteries. Limited intracranial: No evidence of acute intracranial abnormality within the field of view. Visualized orbits: Largely excluded from the field-of-view. No orbital mass or acute finding at the imaged levels. Mastoids and visualized paranasal sinuses: Small mucous retention cyst within the bilateral maxillary sinuses. Small volume fluid within the left sphenoid sinus. No significant mastoid effusion. Skeleton: Sequela of recent C3-C7 ACDF. Superimposed cervical spondylosis. No acute bony abnormality or aggressive osseous lesion. Upper chest: No consolidation within the imaged lung apices. Other: Edema within the left-greater-than-right neck, as well as small foci of gas within the left neck soft tissues, likely related to the recent surgery. IMPRESSION: Sequela of recent C3-C7 ACDF. Prominent prevertebral/retropharyngeal soft tissue thickening and edema spanning the C2-C5 levels measuring up to 2.4 cm in AP dimension. This may reflect soft tissue swelling/edema related to the recent surgery. However, correlate clinically to exclude any signs or symptoms of infection. There is no peripherally enhancing retropharyngeal collection at this time to suggest retropharyngeal abscess. Resultant moderate effacement of the aerodigestive tract at the level of the supraglottic larynx/hypopharynx. Additional edema within the left-greater-than-right neck, as well as small foci of  gas within the left neck soft tissues, likely related to the recent surgery. Electronically Signed   By: Jackey Loge D.O.  On: 10/08/2020 12:50   DG Chest Port 1 View  Result Date: 10/08/2020 CLINICAL DATA:  Questionable sepsis. EXAM: PORTABLE CHEST 1 VIEW COMPARISON:  One-view chest x-ray 07/16/2020 FINDINGS: Heart size is normal. Lung volumes are low. No edema or effusion is present. No focal airspace disease is present. Cervical spine surgery noted. IMPRESSION: 1. Low lung volumes. 2. No acute cardiopulmonary disease. Electronically Signed   By: Marin Roberts M.D.   On: 10/08/2020 10:15   DG C-Arm 1-60 Min-No Report  Result Date: 10/04/2020 CLINICAL DATA:  C3 through T1 ACDF. EXAM: CERVICAL SPINE - 2-3 VIEW; DG C-ARM 1-60 MIN-NO REPORT COMPARISON:  None. FINDINGS: Two C-arm fluoroscopic images were obtained intraoperatively and submitted for post operative interpretation. Postsurgical changes reflecting C3 through T1 ACDF are noted. The C7 and T1 vertebral bodies are not well evaluated. Otherwise, hardware alignment is grossly within expected limits. Fluoro time 15 seconds. Please see the performing provider's procedural report for further detail. IMPRESSION: Status post C3 through T1 ACDF as above. Electronically Signed   By: Lesia Hausen M.D.   On: 10/04/2020 14:10    ASSESSMENT:  S/P cervical surgery with dysphagia   PLAN:  CT is not alarming.  However, difficulty swallowing even saliva.  Worried he will get dehydrated and diabetes will be exacerbated. Will admit to obs for IV hydration and perhaps steroid.  Will discuss with Dr [name].  Lubertha Basque Londen Lorge 10/08/2020, 2:13 PM

## 2020-10-08 NOTE — ED Triage Notes (Signed)
Pt here via EMS from home with complaints of throat swelling, unable to swallow and sore. Pt unable to eat since 10/05/20. Pt has cervical fusion 10/04/20. Swelling began 10/05/20 and increased everyday since. Airway intact and patent during triage.   152/86 HR 104 98% RA 114 CBG 97.5 T

## 2020-10-09 LAB — BASIC METABOLIC PANEL
Anion gap: 12 (ref 5–15)
BUN: 14 mg/dL (ref 8–23)
CO2: 20 mmol/L — ABNORMAL LOW (ref 22–32)
Calcium: 9.6 mg/dL (ref 8.9–10.3)
Chloride: 103 mmol/L (ref 98–111)
Creatinine, Ser: 0.89 mg/dL (ref 0.61–1.24)
GFR, Estimated: >60 mL/min (ref >60)
Glucose, Bld: 136 mg/dL — ABNORMAL HIGH (ref 70–99)
Potassium: 4.1 mmol/L (ref 3.5–5.1)
Sodium: 135 mmol/L (ref 135–145)

## 2020-10-09 LAB — GLUCOSE, CAPILLARY
Glucose-Capillary: 187 mg/dL — ABNORMAL HIGH (ref 70–99)
Glucose-Capillary: 163 mg/dL — ABNORMAL HIGH (ref 70–99)

## 2020-10-09 NOTE — Progress Notes (Signed)
    Patient doing much better this morning, swallowing and throat are much improved. He is very excited and eating breakfast. Continues to deny arm pain and minimal neck pain. Admits to taking collar off regularly for "breaks"    Physical Exam: Vitals:   10/08/20 2338 10/09/20 0346  BP: (!) 149/78 (!) 153/88  Pulse: 82 84  Resp: 18 18  Temp: 97.6 F (36.4 C) 97.7 F (36.5 C)  SpO2: 96% 100%  Sitting up in bed collar rather loose. Neck soft and supple, steri strips still in place over incision. Full strength BIL UE  Dressing in place, CDI NVI  Pt S/P ACDF 10/04/20 re-admit for swallowing difficulties for IV decadron. Pt to get 3 doses, feeling much better alreay  - Complete 3 doses of decadron - Keep a close eye on blood sugars - encourage ambulation - likely d/c home today after lunch once decadron 3rd dose completed and pt has had lunch.  - f/u in 1 week

## 2020-10-09 NOTE — TOC Transition Note (Signed)
Transition of Care [facility]) - CM/SW Discharge Note   Patient Details  Name: Kyle Gray MRN: [id number] Date of Birth: 08/21/1960  [facility] CM/SW Contact:  Kermit Balo, RN Phone Number: 10/09/2020, 10:12 AM   Clinical Narrative:    Patient is discharging home with self care. No needs per TOC.   Final next level of care: Home/Self Care Barriers to Discharge: No Barriers Identified   Patient Goals and CMS Choice        Discharge Placement                       Discharge Plan and Services                                     Social Determinants of Health (SDOH) Interventions     Readmission Risk Interventions No flowsheet data found.

## 2020-10-09 NOTE — Progress Notes (Signed)
Pt wheeled off unit by this RN. All pt's belongings by his side

## 2020-10-10 NOTE — Progress Notes (Signed)
1MG  of Morphine wasted with travler RN as witness

## 2020-10-13 LAB — CULTURE, BLOOD (SINGLE): Culture: NO GROWTH

## 2020-10-17 ENCOUNTER — Other Ambulatory Visit (HOSPITAL_COMMUNITY): Payer: Self-pay | Admitting: Interventional Radiology

## 2020-10-17 DIAGNOSIS — I771 Stricture of artery: Secondary | ICD-10-CM

## 2020-10-18 NOTE — Discharge Summary (Signed)
Patient ID: Kyle Gray MRN: [id number] DOB/AGE: 03/09/1960 60 y.o.  Admit date: 10/08/2020 Discharge date: 10/09/2020  Admission Diagnoses:  Active Problems:   Neck pain with history of cervical spinal surgery   Discharge Diagnoses:  Same  Past Medical History:  Diagnosis Date   Acute ill-defined cerebrovascular disease 04/06/2013   Arteriosclerotic vascular disease 08/25/2019   Arthritis    Asthma    Per patient "mild"   Atopic dermatitis 06/17/2018   Benign hypertensive heart disease 06/11/2018   Benign paroxysmal positional vertigo 06/17/2018   Bilateral carotid artery stenosis 07/16/2016   CAD (coronary artery disease)    Carpal tunnel syndrome 05/14/2015   Diabetes mellitus due to underlying condition with unspecified complications (HCC) 06/17/2018   Essential hypertension 06/17/2018   History of cerebrovascular accident 08/25/2019   Hyperlipidemia 06/03/2013   Hypertension 04/06/2013   Hypokalemia 06/11/2018   Iliac artery aneurysm, bilateral (HCC) 07/16/2016   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   Mild CAD 10/18/2014   luminal irregularities on angiography March 2011 luminal irregularities on angiography March 2011   Non-cardiac chest pain 06/11/2018   Paroxysmal SVT (supraventricular tachycardia) (HCC) 10/18/2014   catheter ablation procedure for treatment of supraventricular tachycardia 2015 catheter ablation procedure for treatment of supraventricular tachycardia 2015   Peripheral vascular disease (HCC) 06/11/2018   Pharyngitis 06/17/2018   Sciatica 06/17/2018   Seasonal allergies    Shortness of breath 06/11/2018   Stroke [facility])    Thoracic or lumbosacral neuritis or radiculitis 04/06/2013   Vertebral artery occlusion 05/14/2015   Vertigo 06/17/2018     Consultants: none  Discharged Condition: Improved  Hospital Course: Parry Po is an 60 y.o. male who was admitted 10/08/2020 for treatment  of swallowing difficulties PO. Started on IV decadronf or 3 doses  Patient was given perioperative antibiotics:  Anti-infectives (From admission, onward)    None        Patient was given sequential compression devices, early ambulation to prevent DVT.  Patient benefited maximally from hospital stay and there were no complications.    Recent vital signs: BP (!) 157/83 (BP Location: Left Arm)   Pulse 95   Temp 98 F (36.7 C) (Oral)   Resp 20   Ht 5\' 8"  (1.727 m)   Wt 94.8 kg   SpO2 99%   BMI 31.78 kg/m    Discharge Medications:   Allergies as of 10/09/2020   No Known Allergies      Medication List     TAKE these medications    albuterol 108 (90 Base) MCG/ACT inhaler Commonly known as: VENTOLIN HFA Inhale 1-2 puffs into the lungs every 6 (six) hours as needed for wheezing or shortness of breath.   amLODipine 10 MG tablet Commonly known as: NORVASC Take 10 mg by mouth daily.   cetirizine 10 MG tablet Commonly known as: ZYRTEC Take 10 mg by mouth daily as needed for allergies.   empagliflozin 10 MG Tabs tablet Commonly known as: JARDIANCE Take 10 mg by mouth daily.   folic acid 1 MG tablet Commonly known as: FOLVITE Take 1 mg by mouth daily.   glimepiride 1 MG tablet Commonly known as: AMARYL Take 1 mg by mouth daily.   HYDROcodone-acetaminophen 5-325 MG tablet Commonly known as: NORCO/VICODIN Take 1 tablet by mouth every 6 (six) hours as needed for moderate pain or severe pain.   latanoprost 0.005 % ophthalmic solution Commonly known as: XALATAN Place 1 drop into both eyes at  bedtime.   losartan-hydrochlorothiazide 100-12.5 MG tablet Commonly known as: HYZAAR Take 1 tablet by mouth daily.   metaxalone 800 MG tablet Commonly known as: SKELAXIN Take 800 mg by mouth every 8 (eight) hours as needed.   metFORMIN 1000 MG tablet Commonly known as: GLUCOPHAGE Take 1,000 mg by mouth 2 (two) times daily with a meal.   methocarbamol 500 MG  tablet Commonly known as: Robaxin Take 1 tablet (500 mg total) by mouth every 6 (six) hours as needed for muscle spasms.   metoprolol succinate 50 MG 24 hr tablet Commonly known as: TOPROL-XL Take 50 mg by mouth daily.   omeprazole 20 MG capsule Commonly known as: PRILOSEC Take 20 mg by mouth daily as needed for heartburn.   Praluent 150 MG/ML Soaj Generic drug: Alirocumab Inject 150 mg into the skin every 14 (fourteen) days.   pregabalin 75 MG capsule Commonly known as: LYRICA Take 75 mg by mouth daily as needed (Nerve pain).   rosuvastatin 40 MG tablet Commonly known as: CRESTOR Take 40 mg by mouth daily.   traMADol 50 MG tablet Commonly known as: ULTRAM Take 50 mg by mouth every 6 (six) hours as needed for pain.        Diagnostic Studies: DG Cervical Spine 2 or 3 views  Result Date: 10/04/2020 CLINICAL DATA:  C3 through T1 ACDF. EXAM: CERVICAL SPINE - 2-3 VIEW; DG C-ARM 1-60 MIN-NO REPORT COMPARISON:  None. FINDINGS: Two C-arm fluoroscopic images were obtained intraoperatively and submitted for post operative interpretation. Postsurgical changes reflecting C3 through T1 ACDF are noted. The C7 and T1 vertebral bodies are not well evaluated. Otherwise, hardware alignment is grossly within expected limits. Fluoro time 15 seconds. Please see the performing provider's procedural report for further detail. IMPRESSION: Status post C3 through T1 ACDF as above. Electronically Signed   By: Lesia Hausen M.D.   On: 10/04/2020 14:10   CT Soft Tissue Neck W Contrast  Result Date: 10/08/2020 CLINICAL DATA:  Neck abscess, deep tissue. Additional history provided: Throat swelling, unable to swallow, sore. Patient unable to eat since 10/05/2020. History of cervical fusion. EXAM: CT NECK WITH CONTRAST TECHNIQUE: Multidetector CT imaging of the neck was performed using the standard protocol following the bolus administration of intravenous contrast. CONTRAST:  35mL OMNIPAQUE IOHEXOL 350 MG/ML SOLN  COMPARISON:  Intraoperative fluoroscopic images from ACDF 10/04/2020. neck CT 08/06/2013. FINDINGS: Pharynx and larynx: There is prominent prevertebral/retropharyngeal soft tissue thickening and edema spanning the C2-C5 levels measuring up to 2.4 cm in AP dimension. There is no peripherally enhancing retropharyngeal collection at this time to suggest retropharyngeal abscess. There is resultant moderate effacement of the aerodigestive tract at the level of the supraglottic larynx/hypopharynx. No appreciable swelling or discrete mass elsewhere within the oral cavity, pharynx or larynx. Salivary glands: No inflammation, mass, or stone. Thyroid: Unremarkable. Lymph nodes: No pathologically enlarged cervical chain lymph nodes. Vascular: The major vascular structures of the neck are patent. Atherosclerotic plaque within the visualized aortic arch, proximal major branch vessels of the neck as well as internal carotid arteries and vertebral arteries. Limited intracranial: No evidence of acute intracranial abnormality within the field of view. Visualized orbits: Largely excluded from the field-of-view. No orbital mass or acute finding at the imaged levels. Mastoids and visualized paranasal sinuses: Small mucous retention cyst within the bilateral maxillary sinuses. Small volume fluid within the left sphenoid sinus. No significant mastoid effusion. Skeleton: Sequela of recent C3-C7 ACDF. Superimposed cervical spondylosis. No acute bony abnormality or aggressive osseous lesion.  Upper chest: No consolidation within the imaged lung apices. Other: Edema within the left-greater-than-right neck, as well as small foci of gas within the left neck soft tissues, likely related to the recent surgery. IMPRESSION: Sequela of recent C3-C7 ACDF. Prominent prevertebral/retropharyngeal soft tissue thickening and edema spanning the C2-C5 levels measuring up to 2.4 cm in AP dimension. This may reflect soft tissue swelling/edema related to the  recent surgery. However, correlate clinically to exclude any signs or symptoms of infection. There is no peripherally enhancing retropharyngeal collection at this time to suggest retropharyngeal abscess. Resultant moderate effacement of the aerodigestive tract at the level of the supraglottic larynx/hypopharynx. Additional edema within the left-greater-than-right neck, as well as small foci of gas within the left neck soft tissues, likely related to the recent surgery. Electronically Signed   By: Jackey Loge D.O.   On: 10/08/2020 12:50   DG Chest Port 1 View  Result Date: 10/08/2020 CLINICAL DATA:  Questionable sepsis. EXAM: PORTABLE CHEST 1 VIEW COMPARISON:  One-view chest x-ray 07/16/2020 FINDINGS: Heart size is normal. Lung volumes are low. No edema or effusion is present. No focal airspace disease is present. Cervical spine surgery noted. IMPRESSION: 1. Low lung volumes. 2. No acute cardiopulmonary disease. Electronically Signed   By: Marin Roberts M.D.   On: 10/08/2020 10:15   DG C-Arm 1-60 Min-No Report  Result Date: 10/04/2020 CLINICAL DATA:  C3 through T1 ACDF. EXAM: CERVICAL SPINE - 2-3 VIEW; DG C-ARM 1-60 MIN-NO REPORT COMPARISON:  None. FINDINGS: Two C-arm fluoroscopic images were obtained intraoperatively and submitted for post operative interpretation. Postsurgical changes reflecting C3 through T1 ACDF are noted. The C7 and T1 vertebral bodies are not well evaluated. Otherwise, hardware alignment is grossly within expected limits. Fluoro time 15 seconds. Please see the performing provider's procedural report for further detail. IMPRESSION: Status post C3 through T1 ACDF as above. Electronically Signed   By: Lesia Hausen M.D.   On: 10/04/2020 14:10    Disposition: Discharge disposition: 01-Home or Self Care       Discharge Instructions     Discharge patient   Complete by: As directed    After completion of 3 doses decadron IV and finished eating lunch   Discharge disposition:  01-Home or Self Care   Discharge patient date: 10/09/2020      Pt S/P ACDF 10/04/20 re-admit for swallowing difficulties for IV decadron. Pt to get 3 doses, feeling much better alreay   - Complete 3 doses of decadron - Keep a close eye on blood sugars - encourage ambulation - d/c home today after lunch once decadron 3rd dose completed and pt has had lunch.  - f/u in 1 week  -Scripts for pain sent to pharmacy electronically  -D/C instructions sheet printed and in chart -D/C today  -F/U in office 2 weeks   Signed: Eilene Ghazi Saleen Peden 10/18/2020, 1:20 PM

## 2020-10-31 DIAGNOSIS — R1031 Right lower quadrant pain: Secondary | ICD-10-CM

## 2020-10-31 DIAGNOSIS — R1032 Left lower quadrant pain: Secondary | ICD-10-CM

## 2020-10-31 HISTORY — DX: Left lower quadrant pain: R10.32

## 2020-10-31 HISTORY — DX: Right lower quadrant pain: R10.31

## 2020-11-02 ENCOUNTER — Ambulatory Visit (HOSPITAL_COMMUNITY)
Admission: RE | Admit: 2020-11-02 | Discharge: 2020-11-02 | Disposition: A | Discharge status: Home / Self Care | Payer: BC Managed Care – PPO | Source: Ambulatory Visit | Attending: Interventional Radiology | Admitting: Interventional Radiology

## 2020-11-02 ENCOUNTER — Other Ambulatory Visit: Payer: Self-pay

## 2020-11-02 ENCOUNTER — Encounter (HOSPITAL_COMMUNITY): Payer: Self-pay

## 2020-11-02 DIAGNOSIS — I771 Stricture of artery: Secondary | ICD-10-CM | POA: Diagnosis present

## 2020-11-02 MED ORDER — IOHEXOL 350 MG/ML SOLN
80.0000 mL | Freq: Once | INTRAVENOUS | Status: AC | PRN
  Administered 2020-11-02: 80 mL via INTRAVENOUS

## 2020-11-05 ENCOUNTER — Telehealth (HOSPITAL_COMMUNITY): Payer: Self-pay

## 2020-11-05 NOTE — Telephone Encounter (Signed)
Pt agreed to f/u in 1 year with cta head/neck. AW  

## 2020-11-09 ENCOUNTER — Encounter: Payer: Self-pay | Admitting: Gastroenterology

## 2020-11-20 ENCOUNTER — Other Ambulatory Visit: Payer: Self-pay

## 2020-11-20 DIAGNOSIS — M1990 Unspecified osteoarthritis, unspecified site: Secondary | ICD-10-CM | POA: Insufficient documentation

## 2020-11-20 DIAGNOSIS — J45909 Unspecified asthma, uncomplicated: Secondary | ICD-10-CM | POA: Insufficient documentation

## 2020-11-21 ENCOUNTER — Encounter: Payer: Self-pay | Admitting: Gastroenterology

## 2020-12-03 ENCOUNTER — Ambulatory Visit: Payer: BC Managed Care – PPO | Admitting: Cardiology

## 2020-12-05 ENCOUNTER — Other Ambulatory Visit: Payer: Self-pay

## 2020-12-05 ENCOUNTER — Encounter: Payer: Self-pay | Admitting: Cardiology

## 2020-12-05 ENCOUNTER — Ambulatory Visit (INDEPENDENT_AMBULATORY_CARE_PROVIDER_SITE_OTHER): Payer: BC Managed Care – PPO | Admitting: Cardiology

## 2020-12-05 VITALS — BP 154/80 | HR 80 | Ht 68.0 in | Wt 208.6 lb

## 2020-12-05 DIAGNOSIS — I110 Hypertensive heart disease with heart failure: Secondary | ICD-10-CM | POA: Diagnosis not present

## 2020-12-05 DIAGNOSIS — I10 Essential (primary) hypertension: Secondary | ICD-10-CM | POA: Diagnosis not present

## 2020-12-05 DIAGNOSIS — E088 Diabetes mellitus due to underlying condition with unspecified complications: Secondary | ICD-10-CM

## 2020-12-05 DIAGNOSIS — E782 Mixed hyperlipidemia: Secondary | ICD-10-CM

## 2020-12-05 DIAGNOSIS — I471 Supraventricular tachycardia: Secondary | ICD-10-CM | POA: Diagnosis not present

## 2020-12-05 DIAGNOSIS — I2510 Atherosclerotic heart disease of native coronary artery without angina pectoris: Secondary | ICD-10-CM

## 2020-12-05 NOTE — Patient Instructions (Signed)

## 2020-12-05 NOTE — Progress Notes (Signed)
Cardiology Office Note:    Date:  12/05/2020   ID:  Kyle Gray, DOB 16-Jul-1960, MRN [id number]  PCP:  Maris Berger, MD  Cardiologist:  Garwin Brothers, MD   Referring MD: Maris Berger, MD    ASSESSMENT:    1. 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 Mixed hyperlipidemia   3. Primary hypertension   4. Paroxysmal SVT (supraventricular tachycardia) (HCC)   5. Essential hypertension   6. Coronary artery disease involving native coronary artery of native heart without angina pectoris   7. Diabetes mellitus due to underlying condition with unspecified complications (HCC)    PLAN:    In order of problems listed above:  Coronary artery disease: Secondary prevention stressed with patient.  Importance of compliance with diet medication stressed and vocalized understanding.  He was advised to ambulate to the best of his ability. Essential hypertension: Blood pressure stable and diet was emphasized.  Lifestyle modification urged.  Hopefully pain management will help his blood pressure issues also. Mixed dyslipidemia: Diet was emphasized.  Lifestyle modification urged.  Lipids followed by primary care.  Lipids were reviewed from [facility] sheet. Diabetes mellitus and obesity: Patient is doing well with diet.  He tells me that his last hemoglobin A1c was 6.  He is happy about it.  He is trying to lose weight. Patient will be seen in follow-up appointment in 6 months or earlier if the patient has any concerns    Medication Adjustments/Labs and Tests Ordered: Current medicines are reviewed at length with the patient today.  Concerns regarding medicines are outlined above.  No orders of the defined types were placed in this encounter.  No orders of the defined types were placed in this encounter.    No chief complaint on file.    History of Present Illness:    Kyle Gray is a 60 y.o. male.  Patient has past medical history of coronary artery disease,  essential hypertension, dyslipidemia, diabetes mellitus and obesity.  He recently underwent neck surgery and is recuperating from it.  He denies any chest pain orthopnea or PND.  At the time of my evaluation, the patient is alert awake oriented and in no distress.  He mentions to me that his blood pressure is fine at home and the range of 130 systolic and 70 diastolic but when he goes out he has pain in the neck and that HIS blood pressure elevated.  At the time of my evaluation, the patient is alert awake oriented and in no distress.  Past Medical History:  Diagnosis Date   Acute ill-defined cerebrovascular disease 04/06/2013   Arteriosclerotic vascular disease 08/25/2019   Arthritis    Asthma    Per patient "mild"   Atopic dermatitis 06/17/2018   Benign hypertensive heart disease 06/11/2018   Benign paroxysmal positional vertigo 06/17/2018   Bilateral carotid artery stenosis 07/16/2016   CAD (coronary artery disease)    Carpal tunnel syndrome 05/14/2015   Chest discomfort 06/17/2018   Coughing 06/11/2018   Diabetes mellitus due to underlying condition with unspecified complications (HCC) 06/17/2018   Diabetes mellitus, new onset (HCC) 06/11/2018   Essential hypertension 06/17/2018   History of cerebrovascular accident 08/25/2019   Hyperlipidemia 06/03/2013   Hypertension 04/06/2013   Hypokalemia 06/11/2018   Iliac artery aneurysm, bilateral (HCC) 07/16/2016   Inguinal pain of both sides 10/31/2020   Low back pain 05/14/2015   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   Mild CAD  10/18/2014   luminal irregularities on angiography March 2011 luminal irregularities on angiography March 2011   Neck pain 05/14/2015   Neck pain with history of cervical spinal surgery 10/08/2020   Non-cardiac chest pain 06/11/2018   Paroxysmal SVT (supraventricular tachycardia) (HCC) 10/18/2014   catheter ablation procedure for treatment of supraventricular tachycardia  2015 catheter ablation procedure for treatment of supraventricular tachycardia 2015   Peripheral vascular disease (HCC) 06/11/2018   Pharyngitis 06/17/2018   Sciatica 06/17/2018   Seasonal allergies    Shortness of breath 06/11/2018   Stroke [facility])    Thoracic or lumbosacral neuritis or radiculitis 04/06/2013   Vertebral artery occlusion 05/14/2015   Vertigo 06/17/2018    Past Surgical History:  Procedure Laterality Date   ANTERIOR CERVICAL DECOMPRESSION/DISCECTOMY FUSION 4 LEVELS N/A 10/04/2020   Procedure: ANTERIOR CERVICAL DECOMPRESSION FUSION CERVICAL 3- CERVICAL 4, CERVICAL 4- CERVICAL 5, CERVICAL 5- CERVICAL 6  CERVICAL 6- CERVICAL 7 WITH INSTRUMENTATION AND ALLOGRAFT;  Surgeon: Estill Bamberg, MD;  Location: [facility] OR;  Service: Orthopedics;  Laterality: N/A;   CARDIAC SURGERY     EYE SURGERY     HERNIA REPAIR     IR ANGIO INTRA EXTRACRAN SEL COM CAROTID INNOMINATE BILAT MOD SED  08/19/2019   IR ANGIO VERTEBRAL SEL SUBCLAVIAN INNOMINATE UNI L MOD SED  08/19/2019   IR ANGIO VERTEBRAL SEL VERTEBRAL UNI R MOD SED  08/19/2019   STENT PLACEMENT VASCULAR ([facility] HX)      Current Medications: Current Meds  Medication Sig   albuterol (VENTOLIN HFA) 108 (90 Base) MCG/ACT inhaler Inhale 1-2 puffs into the lungs every 6 (six) hours as needed for wheezing or shortness of breath.   Alirocumab (PRALUENT) 150 MG/ML SOAJ Inject 150 mg into the skin every 14 (fourteen) days.   amLODipine (NORVASC) 10 MG tablet Take 10 mg by mouth daily.   cetirizine (ZYRTEC) 10 MG tablet Take 10 mg by mouth daily as needed for allergies.   empagliflozin (JARDIANCE) 10 MG TABS tablet Take 10 mg by mouth daily.   folic acid (FOLVITE) 1 MG tablet Take 1 mg by mouth daily.   glimepiride (AMARYL) 1 MG tablet Take 1 mg by mouth daily.   HYDROcodone-acetaminophen (NORCO/VICODIN) 5-325 MG tablet Take 1 tablet by mouth every 6 (six) hours as needed for moderate pain or severe pain.   latanoprost (XALATAN) 0.005 % ophthalmic  solution Place 1 drop into both eyes at bedtime.   losartan-hydrochlorothiazide (HYZAAR) 100-12.5 MG tablet Take 1 tablet by mouth daily.   metaxalone (SKELAXIN) 800 MG tablet Take 800 mg by mouth every 8 (eight) hours as needed for muscle spasms.   metFORMIN (GLUCOPHAGE) 1000 MG tablet Take 1,000 mg by mouth 2 (two) times daily with a meal.   methocarbamol (ROBAXIN) 500 MG tablet Take 1 tablet (500 mg total) by mouth every 6 (six) hours as needed for muscle spasms.   metoprolol succinate (TOPROL-XL) 50 MG 24 hr tablet Take 50 mg by mouth daily.   omeprazole (PRILOSEC) 20 MG capsule Take 20 mg by mouth daily as needed for heartburn.   pregabalin (LYRICA) 75 MG capsule Take 75 mg by mouth daily as needed for pain (Nerve pain).   rosuvastatin (CRESTOR) 40 MG tablet Take 40 mg by mouth daily.   traMADol (ULTRAM) 50 MG tablet Take 50 mg by mouth every 6 (six) hours as needed for pain.     Allergies:   Atorvastatin, Ketorolac, and Oxycodone   Social History   Socioeconomic History   Marital status:  Married    Spouse name: Not on file   Number of children: Not on file   Years of education: Not on file   Highest education level: Not on file  Occupational History   Not on file  Tobacco Use   Smoking status: Every Day    Types: Cigarettes   Smokeless tobacco: Never  Vaping Use   Vaping Use: Never used  Substance and Sexual Activity   Alcohol use: Not Currently   Drug use: Never   Sexual activity: Not on file  Other Topics Concern   Not on file  Social History Narrative   Not on file   Social Determinants of Health   Financial Resource Strain: Not on file  Food Insecurity: Not on file  Transportation Needs: Not on file  Physical Activity: Not on file  Stress: Not on file  Social Connections: Not on file     Family History: The patient's family history includes Heart attack in his mother; Stroke in his father and maternal grandfather.  ROS:   Please see the history of  present illness.    All other systems reviewed and are negative.  EKGs/Labs/Other Studies Reviewed:    The following studies were reviewed today: I discussed my findings with the patient in extensive length   Recent Labs: 10/08/2020: ALT 21; Hemoglobin 14.0; Platelets 208 10/09/2020: BUN 14; Creatinine, Ser 0.89; Potassium 4.1; Sodium 135  Recent Lipid Panel No results found for: CHOL, TRIG, HDL, CHOLHDL, VLDL, LDLCALC, LDLDIRECT  Physical Exam:    VS:  BP (!) 154/80   Pulse 80   Ht 5\' 8"  (1.727 m)   Wt 208 lb 9.6 oz (94.6 kg)   SpO2 98%   BMI 31.72 kg/m     Wt Readings from Last 3 Encounters:  12/05/20 208 lb 9.6 oz (94.6 kg)  10/08/20 209 lb (94.8 kg)  10/04/20 209 lb (94.8 kg)     GEN: Patient is in no acute distress HEENT: Normal NECK: No JVD; No carotid bruits LYMPHATICS: No lymphadenopathy CARDIAC: Hear sounds regular, 2/6 systolic murmur at the apex. RESPIRATORY:  Clear to auscultation without rales, wheezing or rhonchi  ABDOMEN: Soft, non-tender, non-distended MUSCULOSKELETAL:  No edema; No deformity  SKIN: Warm and dry NEUROLOGIC:  Alert and oriented x 3 PSYCHIATRIC:  Normal affect   Signed, 12/04/20, MD  12/05/2020 11:07 AM    [facility]

## 2020-12-14 ENCOUNTER — Ambulatory Visit (INDEPENDENT_AMBULATORY_CARE_PROVIDER_SITE_OTHER): Payer: BC Managed Care – PPO | Admitting: Gastroenterology

## 2020-12-14 ENCOUNTER — Encounter: Payer: Self-pay | Admitting: Gastroenterology

## 2020-12-14 ENCOUNTER — Other Ambulatory Visit: Payer: Self-pay

## 2020-12-14 VITALS — BP 140/82 | HR 79 | Ht 68.0 in | Wt 210.0 lb

## 2020-12-14 DIAGNOSIS — Z86010 Personal history of colonic polyps: Secondary | ICD-10-CM | POA: Diagnosis not present

## 2020-12-14 NOTE — Patient Instructions (Addendum)
If you are age 61 or older, your body mass index should be between 23-30. Your Body mass index is 31.93 kg/m. If this is out of the aforementioned range listed, please consider follow up with your Primary Care Provider.  If you are age 48 or younger, your body mass index should be between 19-25. Your Body mass index is 31.93 kg/m. If this is out of the aformentioned range listed, please consider follow up with your Primary Care Provider.   ________________________________________________________  The [facility] providers would like to encourage you to use [facility] to communicate with providers for non-urgent requests or questions.  Due to long hold times on the telephone, sending your provider a message by [facility] may be a faster and more efficient way to get a response.  Please allow 48 business hours for a response.  Please remember that this is for non-urgent requests.  _______________________________________________________  Bonita Quin have been scheduled for a colonoscopy. Please follow written instructions given to you at your visit today.  Please pick up your prep supplies at the pharmacy within the next 1-3 days. If you use inhalers (even only as needed), please bring them with you on the day of your procedure.  You will be contacted by our office prior to your procedure for directions on holding your Plavix.  If you do not hear from our office 1 week prior to your scheduled procedure, please call [phone] to discuss.   Please call with any questions or concerns.  Thank you,  Dr. Lynann Bologna

## 2020-12-14 NOTE — Progress Notes (Signed)
Chief Complaint: for colol  Referring Provider:  Maris Berger, MD      ASSESSMENT AND PLAN;   #1. H/O colon polyps  Plan: -Colon after cardiology clearance and to hold Plavix 5 days before. Continue ASA throughout.   Discussed risks & benefits of colonoscopy. Risks including rare perforation req laparotomy, bleeding after bx/polypectomy req blood transfusion, rarely missing neoplasms, risks of anesthesia/sedation, rare risk of damage to internal organs. Benefits outweigh the risks. Patient agrees to proceed. All the questions were answered. Pt consents to proceed.  HPI:    Kyle Gray is a 60 y.o. male  With DM2, CAD (No CHF, EF 60-65% 06/2018), HTH, HLD, CVA (2010, 2011), B/L iliac aneurysms on ASA/plavix  For FU colon.  No nausea, vomiting, heartburn, regurgitation, odynophagia or dysphagia.  No significant diarrhea or constipation.  No melena or hematochezia. No unintentional weight loss. No abdominal pain.  Past GI procedures EGD 12/2015: Normal EGD.  Complete healing of gastric ulcers. Colonoscopy 08/2015 ([facility]): Colonic polyp s/p polypectomy.  Small internal hemorrhoids. Bx- TA Rpt 5 yrs   Past Medical History:  Diagnosis Date   Acute ill-defined cerebrovascular disease 04/06/2013   Arteriosclerotic vascular disease 08/25/2019   Arthritis    Asthma    Per patient "mild"   Atopic dermatitis 06/17/2018   Benign hypertensive heart disease 06/11/2018   Benign paroxysmal positional vertigo 06/17/2018   Bilateral carotid artery stenosis 07/16/2016   CAD (coronary artery disease)    Carpal tunnel syndrome 05/14/2015   Chest discomfort 06/17/2018   Coughing 06/11/2018   CVA (cerebral vascular accident) (HCC)    Diabetes mellitus due to underlying condition with unspecified complications (HCC) 06/17/2018   Diabetes mellitus, new onset (HCC) 06/11/2018   Essential hypertension 06/17/2018   History of cerebrovascular accident 08/25/2019   Hyperlipidemia  06/03/2013   Hypertension 04/06/2013   Hypokalemia 06/11/2018   Iliac artery aneurysm, bilateral (HCC) 07/16/2016   Inguinal pain of both sides 10/31/2020   Low back pain 05/14/2015   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   Mild CAD 10/18/2014   luminal irregularities on angiography March 2011 luminal irregularities on angiography March 2011   Neck pain 05/14/2015   Neck pain with history of cervical spinal surgery 10/08/2020   Non-cardiac chest pain 06/11/2018   Paroxysmal SVT (supraventricular tachycardia) (HCC) 10/18/2014   catheter ablation procedure for treatment of supraventricular tachycardia 2015 catheter ablation procedure for treatment of supraventricular tachycardia 2015   Peripheral vascular disease (HCC) 06/11/2018   Pharyngitis 06/17/2018   Sciatica 06/17/2018   Seasonal allergies    Shortness of breath 06/11/2018   Stroke 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 Small internal hemorrhoids.   ESOPHAGOGASTRODUODENOSCOPY  12/14/2015   Normal EGD. Complete healing of gastric ulcers. No Bx obtained as pt didnt stop his plavix   EYE SURGERY     HERNIA REPAIR     IR ANGIO INTRA EXTRACRAN  SEL COM CAROTID INNOMINATE BILAT MOD SED  08/19/2019   IR ANGIO VERTEBRAL SEL SUBCLAVIAN INNOMINATE UNI L MOD SED  08/19/2019   IR ANGIO VERTEBRAL SEL VERTEBRAL UNI R MOD  SED  08/19/2019   STENT PLACEMENT VASCULAR ([facility] HX)      Family History  Problem Relation Age of Onset   Heart attack Mother    Stroke Father    Cancer Brother    Liver cancer Brother    Stroke Maternal Grandfather    Colon cancer Neg Hx    Rectal cancer Neg Hx    Stomach cancer Neg Hx     Social History   Tobacco Use   Smoking status: Every Day    Packs/day: 1.00    Types: Cigarettes   Smokeless tobacco: Never  Vaping Use   Vaping Use: Never used  Substance Use Topics   Alcohol use: Not Currently   Drug use: Never    Current Outpatient Medications  Medication Sig Dispense Refill   albuterol (VENTOLIN HFA) 108 (90 Base) MCG/ACT inhaler Inhale 1-2 puffs into the lungs every 6 (six) hours as needed for wheezing or shortness of breath.     Alirocumab (PRALUENT) 150 MG/ML SOAJ Inject 150 mg into the skin every 14 (fourteen) days.     amLODipine (NORVASC) 10 MG tablet Take 10 mg by mouth daily.     cetirizine (ZYRTEC) 10 MG tablet Take 10 mg by mouth daily as needed for allergies.     clopidogrel (PLAVIX) 75 MG tablet Take 75 mg by mouth daily.     empagliflozin (JARDIANCE) 10 MG TABS tablet Take 10 mg by mouth daily.     folic acid (FOLVITE) 1 MG tablet Take 1 mg by mouth daily.     glimepiride (AMARYL) 1 MG tablet Take 1 mg by mouth daily.     HYDROcodone-acetaminophen (NORCO/VICODIN) 5-325 MG tablet Take 1 tablet by mouth every 6 (six) hours as needed for moderate pain or severe pain. 20 tablet 0   latanoprost (XALATAN) 0.005 % ophthalmic solution Place 1 drop into both eyes at bedtime.     losartan-hydrochlorothiazide (HYZAAR) 100-12.5 MG tablet Take 1 tablet by mouth daily.     metaxalone (SKELAXIN) 800 MG tablet Take 800 mg by mouth every 8 (eight) hours as needed for muscle spasms.     metFORMIN (GLUCOPHAGE) 1000 MG tablet Take 1,000 mg by mouth 2 (two) times daily with a meal.     methocarbamol (ROBAXIN) 500 MG tablet Take 1 tablet (500 mg total) by mouth every 6  (six) hours as needed for muscle spasms. 60 tablet 0   metoprolol succinate (TOPROL-XL) 50 MG 24 hr tablet Take 50 mg by mouth daily.     omeprazole (PRILOSEC) 20 MG capsule Take 20 mg by mouth daily as needed for heartburn.     pregabalin (LYRICA) 75 MG capsule Take 75 mg by mouth daily as needed for pain (Nerve pain).     rosuvastatin (CRESTOR) 40 MG tablet Take 40 mg by mouth daily.     traMADol (ULTRAM) 50 MG tablet Take 50 mg by mouth every 6 (six) hours as needed for pain.     No current facility-administered medications for this visit.    Allergies  Allergen Reactions   Atorvastatin     Other reaction(s): Myalgias (intolerance), Other (See Comments)   Ketorolac     Other reaction(s): Other (See Comments)   Oxycodone Nausea And Vomiting    After one pill patient throws up  Review of Systems:  Constitutional: Denies fever, chills, diaphoresis, appetite change and fatigue.  HEENT: Denies photophobia, eye pain, redness, hearing loss, ear pain, congestion, sore throat, rhinorrhea, sneezing, mouth sores, neck pain, neck stiffness and tinnitus.   Respiratory: Denies SOB, DOE, cough, chest tightness,  and wheezing.   Cardiovascular: Denies chest pain, palpitations and leg swelling.  Genitourinary: Denies dysuria, urgency, frequency, hematuria, flank pain and difficulty urinating.  Musculoskeletal: Denies myalgias, back pain, joint swelling, arthralgias and gait problem.  Skin: No rash.  Neurological: Denies dizziness, seizures, syncope, weakness, light-headedness, numbness and headaches.  Hematological: Denies adenopathy. Easy bruising, personal or family bleeding history  Psychiatric/Behavioral: No anxiety or depression     Physical Exam:    BP 140/82   Pulse 79   Ht 5\' 8"  (1.727 m)   Wt 210 lb (95.3 kg)   SpO2 99%   BMI 31.93 kg/m  Wt Readings from Last 3 Encounters:  12/14/20 210 lb (95.3 kg)  12/05/20 208 lb 9.6 oz (94.6 kg)  10/08/20 209 lb (94.8 kg)    Constitutional:  Well-developed, in no acute distress. Psychiatric: Normal mood and affect. Behavior is normal. HEENT: Pupils normal.  Conjunctivae are normal. No scleral icterus. Neck supple.  Cardiovascular: Normal rate, regular rhythm. No edema Pulmonary/chest: Effort normal and breath sounds normal. No wheezing, rales or rhonchi. Abdominal: Soft, nondistended. Nontender. Bowel sounds active throughout. There are no masses palpable. No hepatomegaly. Rectal: Deferred Neurological: Alert and oriented to person place and time. Skin: Skin is warm and dry. No rashes noted.  Data Reviewed: I have personally reviewed following labs and imaging studies  CBC: CBC Latest Ref Rng & Units 10/08/2020 09/25/2020 08/19/2019  WBC 4.0 - 10.5 K/uL 5.5 8.3 6.2  Hemoglobin 13.0 - 17.0 g/dL 14.0 16.8 14.8  Hematocrit 39.0 - 52.0 % 43.6 51.2 46.6  Platelets 150 - 400 K/uL 208 166 164    CMP: CMP Latest Ref Rng & Units 10/09/2020 10/08/2020 09/25/2020  Glucose 70 - 99 mg/dL 136(H) 105(H) 206(H)  BUN 8 - 23 mg/dL 14 13 11   Creatinine 0.61 - 1.24 mg/dL 0.89 1.03 0.95  Sodium 135 - 145 mmol/L 135 139 137  Potassium 3.5 - 5.1 mmol/L 4.1 3.8 3.3(L)  Chloride 98 - 111 mmol/L 103 106 100  CO2 22 - 32 mmol/L 20(L) 18(L) 27  Calcium 8.9 - 10.3 mg/dL 9.6 10.1 9.7  Total Protein 6.5 - 8.1 g/dL - 7.4 7.4  Total Bilirubin 0.3 - 1.2 mg/dL - 2.9(H) 1.3(H)  Alkaline Phos 38 - 126 U/L - 87 105  AST 15 - 41 U/L - 28 21  ALT 0 - 44 U/L - 21 21        Carmell Austria, MD 12/14/2020, 8:52 AM  Cc: Gala Lewandowsky, MD

## 2020-12-17 ENCOUNTER — Telehealth: Payer: Self-pay

## 2020-12-17 NOTE — Telephone Encounter (Signed)
Spoke with the patient. Patient made aware to hold plavix 5 days before procedure. Patient verbalized understanding.

## 2021-01-01 ENCOUNTER — Encounter: Payer: Self-pay | Admitting: Gastroenterology

## 2021-01-01 ENCOUNTER — Ambulatory Visit (AMBULATORY_SURGERY_CENTER): Payer: BC Managed Care – PPO | Admitting: Gastroenterology

## 2021-01-01 ENCOUNTER — Other Ambulatory Visit: Payer: Self-pay

## 2021-01-01 VITALS — BP 113/67 | HR 64 | Temp 97.1°F | Resp 18 | Ht 68.0 in | Wt 210.0 lb

## 2021-01-01 DIAGNOSIS — Z86010 Personal history of colonic polyps: Secondary | ICD-10-CM | POA: Diagnosis not present

## 2021-01-01 MED ORDER — SODIUM CHLORIDE 0.9 % IV SOLN: 500.0000 mL | Freq: Once | INTRAVENOUS | Status: DC

## 2021-01-01 NOTE — Progress Notes (Signed)
Pt's states no medical or surgical changes since previsit or office visit.   CHECK-IN-AER  V/S-CW 

## 2021-01-01 NOTE — Progress Notes (Signed)
Report to PACU, RN, vss, BBS= Clear.  

## 2021-01-01 NOTE — Progress Notes (Signed)
Chief Complaint: for colol  Referring Provider:  Gala Lewandowsky, MD      ASSESSMENT AND PLAN;   #1. H/O colon polyps  Plan: -Colon after cardiology clearance (done) and to hold Plavix 5 days before. Continue ASA throughout.   Discussed risks & benefits of colonoscopy. Risks including rare perforation req laparotomy, bleeding after bx/polypectomy req blood transfusion, rarely missing neoplasms, risks of anesthesia/sedation, rare risk of damage to internal organs. Benefits outweigh the risks. Patient agrees to proceed. All the questions were answered. Pt consents to proceed.  HPI:    Kyle Gray is a 60 y.o. male  With DM2, CAD (No CHF, EF 60-65% 06/2018), HTH, HLD, CVA (2010, 2011), B/L iliac aneurysms on ASA/plavix  For FU colon.  No nausea, vomiting, heartburn, regurgitation, odynophagia or dysphagia.  No significant diarrhea or constipation.  No melena or hematochezia. No unintentional weight loss. No abdominal pain.  Past GI procedures EGD 12/2015: Normal EGD.  Complete healing of gastric ulcers. Colonoscopy 08/2015 ([facility]): Colonic polyp s/p polypectomy.  Small internal hemorrhoids. Bx- TA Rpt 5 yrs   Past Medical History:  Diagnosis Date   Acute ill-defined cerebrovascular disease 04/06/2013   Allergy    Arteriosclerotic vascular disease 08/25/2019   Arthritis    Asthma    Per patient "mild"   Atopic dermatitis 06/17/2018   Benign hypertensive heart disease 06/11/2018   Benign paroxysmal positional vertigo 06/17/2018   Bilateral carotid artery stenosis 07/16/2016   CAD (coronary artery disease)    Carpal tunnel syndrome 05/14/2015   Chest discomfort 06/17/2018   Coughing 06/11/2018   CVA (cerebral vascular accident) ([place])    Diabetes mellitus due to underlying condition with unspecified complications ([place]) [id number]   Diabetes mellitus, new onset ([place]) 06/11/2018   Essential hypertension 06/17/2018   GERD (gastroesophageal reflux disease)    Glaucoma     History of cerebrovascular accident 08/25/2019   Hyperlipidemia 06/03/2013   Hypertension 04/06/2013   Hypokalemia 06/11/2018   Iliac artery aneurysm, bilateral ([place]) 07/16/2016   Inguinal pain of both sides 10/31/2020   Low back pain 05/14/2015   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   Mild CAD 10/18/2014   luminal irregularities on angiography March 2011 luminal irregularities on angiography March 2011   Myocardial infarction [facility])    Neck pain 05/14/2015   Neck pain with history of cervical spinal surgery 10/08/2020   Non-cardiac chest pain 06/11/2018   Paroxysmal SVT (supraventricular tachycardia) ([place]) 10/18/2014   catheter ablation procedure for treatment of supraventricular tachycardia 2015 catheter ablation procedure for treatment of supraventricular tachycardia 2015   Peripheral vascular disease ([place]) 06/11/2018   Pharyngitis 06/17/2018   Sciatica 06/17/2018   Seasonal allergies    Shortness of breath 06/11/2018   Stroke [facility])    Thoracic or lumbosacral neuritis or radiculitis 04/06/2013   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 Small internal hemorrhoids.   ESOPHAGOGASTRODUODENOSCOPY  12/14/2015   Normal EGD. Complete healing of gastric ulcers. No Bx obtained as pt  didnt stop his plavix   EYE SURGERY     HERNIA REPAIR     IR ANGIO INTRA EXTRACRAN SEL COM CAROTID INNOMINATE BILAT MOD SED  08/19/2019   IR ANGIO VERTEBRAL  SEL SUBCLAVIAN INNOMINATE UNI L MOD SED  08/19/2019   IR ANGIO VERTEBRAL SEL VERTEBRAL UNI R MOD SED  08/19/2019   STENT PLACEMENT VASCULAR ([facility])      Family History  Problem Relation Age of Onset   Heart attack Mother    Stroke Father    Cancer Brother    Liver cancer Brother    Stroke Maternal Grandfather    Colon cancer Neg Hx    Rectal cancer Neg Hx    Stomach cancer Neg Hx    Colon polyps Neg Hx    Esophageal cancer Neg Hx     Social History   Tobacco Use   Smoking status: Every Day    Packs/day: 1.00    Types: Cigarettes   Smokeless tobacco: Never  Vaping Use   Vaping Use: Never used  Substance Use Topics   Alcohol use: Not Currently   Drug use: Never    Current Outpatient Medications  Medication Sig Dispense Refill   amLODipine (NORVASC) 10 MG tablet Take 10 mg by mouth daily.     HYDROcodone-acetaminophen (NORCO/VICODIN) 5-325 MG tablet Take 1 tablet by mouth every 6 (six) hours as needed for moderate pain or severe pain. 20 tablet 0   latanoprost (XALATAN) 0.005 % ophthalmic solution Place 1 drop into both eyes at bedtime.     losartan-hydrochlorothiazide (HYZAAR) 100-12.5 MG tablet Take 1 tablet by mouth daily.     metFORMIN (GLUCOPHAGE) 1000 MG tablet Take 1,000 mg by mouth 2 (two) times daily with a meal.     metoprolol succinate (TOPROL-XL) 50 MG 24 hr tablet Take 50 mg by mouth daily.     albuterol (VENTOLIN HFA) 108 (90 Base) MCG/ACT inhaler Inhale 1-2 puffs into the lungs every 6 (six) hours as needed for wheezing or shortness of breath.     Alirocumab (PRALUENT) 150 MG/ML SOAJ Inject 150 mg into the skin every 14 (fourteen) days.     cetirizine (ZYRTEC) 10 MG tablet Take 10 mg by mouth daily as needed for allergies.     clopidogrel (PLAVIX) 75 MG tablet Take 75 mg by mouth daily.     empagliflozin (JARDIANCE) 10 MG TABS tablet Take 10 mg by mouth daily.     folic acid (FOLVITE) 1 MG tablet Take 1 mg by mouth daily.     glimepiride (AMARYL) 1 MG  tablet Take 1 mg by mouth daily.     metaxalone (SKELAXIN) 800 MG tablet Take 800 mg by mouth every 8 (eight) hours as needed for muscle spasms.     methocarbamol (ROBAXIN) 500 MG tablet Take 1 tablet (500 mg total) by mouth every 6 (six) hours as needed for muscle spasms. (Patient not taking: Reported on 01/01/2021) 60 tablet 0   omeprazole (PRILOSEC) 20 MG capsule Take 20 mg by mouth daily as needed for heartburn.     pregabalin (LYRICA) 75 MG capsule Take 75 mg by mouth daily as needed for pain (Nerve pain).     rosuvastatin (CRESTOR) 40 MG tablet Take 40 mg by mouth daily.     traMADol (ULTRAM) 50 MG tablet Take 50 mg by mouth every 6 (six) hours as needed for pain. (Patient not taking: Reported on 01/01/2021)     Current Facility-Administered Medications  Medication Dose Route  Frequency Provider Last Rate Last Admin   0.9 %  sodium chloride infusion  500 mL Intravenous Once Lynann Bologna, MD        Allergies  Allergen Reactions   Atorvastatin     Other reaction(s): Myalgias (intolerance), Other (See Comments)   Ketorolac     Other reaction(s): Other (See Comments)   Oxycodone Nausea And Vomiting    After one pill patient throws up      Review of Systems:  Constitutional: Denies fever, chills, diaphoresis, appetite change and fatigue.  HEENT: Denies photophobia, eye pain, redness, hearing loss, ear pain, congestion, sore throat, rhinorrhea, sneezing, mouth sores, neck pain, neck stiffness and tinnitus.   Respiratory: Denies SOB, DOE, cough, chest tightness,  and wheezing.   Cardiovascular: Denies chest pain, palpitations and leg swelling.  Genitourinary: Denies dysuria, urgency, frequency, hematuria, flank pain and difficulty urinating.  Musculoskeletal: Denies myalgias, back pain, joint swelling, arthralgias and gait problem.  Skin: No rash.  Neurological: Denies dizziness, seizures, syncope, weakness, light-headedness, numbness and headaches.  Hematological: Denies adenopathy.  Easy bruising, personal or family bleeding history  Psychiatric/Behavioral: No anxiety or depression     Physical Exam:    BP (!) 97/59 (Patient Position: Sitting)   Pulse 75   Temp (!) 97.1 F (36.2 C)   Ht 5\' 8"  (1.727 m)   Wt 210 lb (95.3 kg)   SpO2 98%   BMI 31.93 kg/m  Wt Readings from Last 3 Encounters:  01/01/21 210 lb (95.3 kg)  12/14/20 210 lb (95.3 kg)  12/05/20 208 lb 9.6 oz (94.6 kg)   Constitutional:  Well-developed, in no acute distress. Psychiatric: Normal mood and affect. Behavior is normal. HEENT: Pupils normal.  Conjunctivae are normal. No scleral icterus. Neck supple.  Cardiovascular: Normal rate, regular rhythm. No edema Pulmonary/chest: Effort normal and breath sounds normal. No wheezing, rales or rhonchi. Abdominal: Soft, nondistended. Nontender. Bowel sounds active throughout. There are no masses palpable. No hepatomegaly. Rectal: Deferred Neurological: Alert and oriented to person place and time. Skin: Skin is warm and dry. No rashes noted.  Data Reviewed: I have personally reviewed following labs and imaging studies  CBC: CBC Latest Ref Rng & Units 10/08/2020 09/25/2020 08/19/2019  WBC 4.0 - 10.5 K/uL 5.5 8.3 6.2  Hemoglobin 13.0 - 17.0 g/dL 08/21/2019 96.0 45.4  Hematocrit 39.0 - 52.0 % 43.6 51.2 46.6  Platelets 150 - 400 K/uL 208 166 164    CMP: CMP Latest Ref Rng & Units 10/09/2020 10/08/2020 09/25/2020  Glucose 70 - 99 mg/dL 09/27/2020) 119(J) 478(G)  BUN 8 - 23 mg/dL 14 13 11   Creatinine 0.61 - 1.24 mg/dL 956(O 1.30  Sodium 135 - 145 mmol/L 135 139 137  Potassium 3.5 - 5.1 mmol/L 4.1 3.8 3.3(L)  Chloride 98 - 111 mmol/L 103 106 100  CO2 22 - 32 mmol/L 20(L) 18(L) 27  Calcium 8.9 - 10.3 mg/dL 9.6 8.65 9.7  Total Protein 6.5 - 8.1 g/dL - 7.4 7.4  Total Bilirubin 0.3 - 1.2 mg/dL - 2.9(H) 1.3(H)  Alkaline Phos 38 - 126 U/L - 87 105  AST 15 - 41 U/L - 28 21  ALT 0 - 44 U/L - 21 21        7.84, MD 01/01/2021, 9:13 AM  Cc: Edman Circle,  MD

## 2021-01-01 NOTE — Op Note (Signed)
[facility] Patient Name: Kyle Gray Procedure Date: 01/01/2021 9:13 AM MRN: [id number] Endoscopist: Lynann Bologna , MD Age: 60 Referring MD:  Date of Birth: 1960/11/25 Gender: Male Account #: [id number] Procedure:                Colonoscopy Indications:              High risk colon cancer surveillance: Personal                            history of colonic polyps Medicines:                Monitored Anesthesia Care Procedure:                Pre-Anesthesia Assessment:                           - Prior to the procedure, a History and Physical                            was performed, and patient medications and                            allergies were reviewed. The patient's tolerance of                            previous anesthesia was also reviewed. The risks                            and benefits of the procedure and the sedation                            options and risks were discussed with the patient.                            All questions were answered, and informed consent                            was obtained. Prior Anticoagulants: The patient has                            taken Plavix (clopidogrel), last dose was 5 days                            prior to procedure. ASA Grade Assessment: III - A                            patient with severe systemic disease. After                            reviewing the risks and benefits, the patient was                            deemed in satisfactory condition to undergo the  procedure.                           After obtaining informed consent, the colonoscope                            was passed under direct vision. Throughout the                            procedure, the patient's blood pressure, pulse, and                            oxygen saturations were monitored continuously. The                            CF [id number] #[id number] was introduced through the anus                             and advanced to the the cecum, identified by                            appendiceal orifice and ileocecal valve. The                            colonoscopy was performed without difficulty. The                            patient tolerated the procedure well. The quality                            of the bowel preparation was good. The ileocecal                            valve, appendiceal orifice, and rectum were                            photographed. Scope In: 9:19:33 AM Scope Out: 9:27:00 AM Scope Withdrawal Time: 0 hours 5 minutes 14 seconds  Total Procedure Duration: 0 hours 7 minutes 27 seconds  Findings:                 A few rare small-mouthed diverticula were found in                            the sigmoid colon.                           Non-bleeding internal hemorrhoids were found during                            retroflexion. The hemorrhoids were small and Grade                            I (internal hemorrhoids that do not prolapse).  The exam was otherwise without abnormality on                            direct and retroflexion views. Complications:            No immediate complications. Estimated Blood Loss:     Estimated blood loss: none. Impression:               - Minimal sigmoid diverticulosis.                           - Non-bleeding internal hemorrhoids.                           - The examination was otherwise normal on direct                            and retroflexion views.                           - No specimens collected. Recommendation:           - Patient has a contact number available for                            emergencies. The signs and symptoms of potential                            delayed complications were discussed with the                            patient. Return to normal activities tomorrow.                            Written discharge instructions were provided to the                            patient.                            - Resume previous diet.                           - Resume Plavix (clopidogrel) at prior dose today.                           - Repeat colonoscopy in 10 years for screening                            purposes. Earlier, if with any new problems or                            change in family history.                           - The findings and recommendations were discussed  with the patient's family. Lynann Bologna, MD 01/01/2021 9:30:32 AM This report has been signed electronically.

## 2021-01-01 NOTE — Patient Instructions (Signed)
Handout given for diverticulosis.  RESUME PLAVIX TODAY AT THE USUAL DOSE.  YOU HAD AN ENDOSCOPIC PROCEDURE TODAY AT THE [facility]:   Refer to the procedure report that was given to you for any specific questions about what was found during the examination.  If the procedure report does not answer your questions, please call your gastroenterologist to clarify.  If you requested that your care partner not be given the details of your procedure findings, then the procedure report has been included in a sealed envelope for you to review at your convenience later.  YOU SHOULD EXPECT: Some feelings of bloating in the abdomen. Passage of more gas than usual.  Walking can help get rid of the air that was put into your GI tract during the procedure and reduce the bloating. If you had a lower endoscopy (such as a colonoscopy or flexible sigmoidoscopy) you may notice spotting of blood in your stool or on the toilet paper. If you underwent a bowel prep for your procedure, you may not have a normal bowel movement for a few days.  Please Note:  You might notice some irritation and congestion in your nose or some drainage.  This is from the oxygen used during your procedure.  There is no need for concern and it should clear up in a day or so.  SYMPTOMS TO REPORT IMMEDIATELY:  Following lower endoscopy (colonoscopy or flexible sigmoidoscopy):  Excessive amounts of blood in the stool  Significant tenderness or worsening of abdominal pains  Swelling of the abdomen that is new, acute  Fever of 100F or higher  For urgent or emergent issues, a gastroenterologist can be reached at any hour by calling [phone]. Do not use MyChart messaging for urgent concerns.    DIET:  We do recommend a small meal at first, but then you may proceed to your regular diet.  Drink plenty of fluids but you should avoid alcoholic beverages for 24 hours.  ACTIVITY:  You should plan to take it easy for the rest of  today and you should NOT DRIVE or use heavy machinery until tomorrow (because of the sedation medicines used during the test).    FOLLOW UP: Our staff will call the number listed on your records 48-72 hours following your procedure to check on you and address any questions or concerns that you may have regarding the information given to you following your procedure. If we do not reach you, we will leave a message.  We will attempt to reach you two times.  During this call, we will ask if you have developed any symptoms of COVID 19. If you develop any symptoms (ie: fever, flu-like symptoms, shortness of breath, cough etc.) before then, please call [phone].  If you test positive for Covid 19 in the 2 weeks post procedure, please call and report this information to [place].    There were no polyps seen today!  You will need another screening colonoscopy in 10 years, you will receive a letter at that time when you are due for the procedure.   Please call us at [phone] if you have a change in bowel habits, change in family history of colo-rectal cancer, rectal bleeding or other GI concern before that time.    SIGNATURES/CONFIDENTIALITY: You and/or your care partner have signed paperwork which will be entered into your electronic medical record.  These signatures attest to the fact that that the information above on your After Visit Summary has been reviewed  and is understood.  Full responsibility of the confidentiality of this discharge information lies with you and/or your care-partner.

## 2021-01-03 ENCOUNTER — Other Ambulatory Visit: Payer: Self-pay | Admitting: Orthopedic Surgery

## 2021-01-03 ENCOUNTER — Telehealth: Payer: Self-pay | Admitting: *Deleted

## 2021-01-03 DIAGNOSIS — M545 Low back pain, unspecified: Secondary | ICD-10-CM

## 2021-01-03 NOTE — Telephone Encounter (Signed)
  Follow up Call-  Call back number 01/01/2021  Post procedure Call Back phone  # [phone]  Permission to leave phone message Yes  Some recent data might be hidden    Spoke with pt's wife.  Patient questions:  Do you have a fever, pain , or abdominal swelling? No. Pain Score  0 *  Have you tolerated food without any problems? Yes.    Have you been able to return to your normal activities? Yes.    Do you have any questions about your discharge instructions: Diet   No. Medications  No. Follow up visit  No.  Do you have questions or concerns about your Care? No.  Actions: * If pain score is 4 or above: No action needed, pain <4.  Have you developed a fever since your procedure? no  2.   Have you had an respiratory symptoms (SOB or cough) since your procedure? no  3.   Have you tested positive for COVID 19 since your procedure no  4.   Have you had any family members/close contacts diagnosed with the COVID 19 since your procedure?  no   If yes to any of these questions please route to Laverna Peace, RN and Karlton Lemon, RN

## 2021-01-23 ENCOUNTER — Ambulatory Visit
Admission: RE | Admit: 2021-01-23 | Discharge: 2021-01-23 | Disposition: A | Discharge status: Home / Self Care | Payer: BC Managed Care – PPO | Source: Ambulatory Visit | Attending: Orthopedic Surgery | Admitting: Orthopedic Surgery

## 2021-01-23 ENCOUNTER — Other Ambulatory Visit: Payer: Self-pay

## 2021-01-23 DIAGNOSIS — M545 Low back pain, unspecified: Secondary | ICD-10-CM

## 2021-04-11 ENCOUNTER — Ambulatory Visit: Payer: BC Managed Care – PPO | Admitting: Podiatry

## 2021-06-04 DIAGNOSIS — K219 Gastro-esophageal reflux disease without esophagitis: Secondary | ICD-10-CM | POA: Insufficient documentation

## 2021-06-04 DIAGNOSIS — I639 Cerebral infarction, unspecified: Secondary | ICD-10-CM | POA: Insufficient documentation

## 2021-06-04 DIAGNOSIS — H409 Unspecified glaucoma: Secondary | ICD-10-CM | POA: Insufficient documentation

## 2021-06-04 DIAGNOSIS — I219 Acute myocardial infarction, unspecified: Secondary | ICD-10-CM | POA: Insufficient documentation

## 2021-06-04 DIAGNOSIS — T7840XA Allergy, unspecified, initial encounter: Secondary | ICD-10-CM | POA: Insufficient documentation

## 2021-06-05 ENCOUNTER — Encounter: Payer: Self-pay | Admitting: Cardiology

## 2021-06-05 ENCOUNTER — Ambulatory Visit: Payer: BC Managed Care – PPO | Admitting: Cardiology

## 2021-06-05 VITALS — BP 126/68 | HR 80 | Ht 68.0 in | Wt 213.6 lb

## 2021-06-05 DIAGNOSIS — E782 Mixed hyperlipidemia: Secondary | ICD-10-CM

## 2021-06-05 DIAGNOSIS — I2510 Atherosclerotic heart disease of native coronary artery without angina pectoris: Secondary | ICD-10-CM

## 2021-06-05 DIAGNOSIS — Z1321 Encounter for screening for nutritional disorder: Secondary | ICD-10-CM | POA: Diagnosis not present

## 2021-06-05 DIAGNOSIS — I110 Hypertensive heart disease with heart failure: Secondary | ICD-10-CM | POA: Diagnosis not present

## 2021-06-05 DIAGNOSIS — IMO0001 Reserved for inherently not codable concepts without codable children: Secondary | ICD-10-CM

## 2021-06-05 DIAGNOSIS — I10 Essential (primary) hypertension: Secondary | ICD-10-CM

## 2021-06-05 DIAGNOSIS — F17200 Nicotine dependence, unspecified, uncomplicated: Secondary | ICD-10-CM

## 2021-06-05 DIAGNOSIS — I471 Supraventricular tachycardia: Secondary | ICD-10-CM

## 2021-06-05 DIAGNOSIS — E088 Diabetes mellitus due to underlying condition with unspecified complications: Secondary | ICD-10-CM

## 2021-06-05 HISTORY — DX: Reserved for inherently not codable concepts without codable children: IMO0001

## 2021-06-05 HISTORY — DX: Nicotine dependence, unspecified, uncomplicated: F17.200

## 2021-06-05 NOTE — Progress Notes (Addendum)
?Cardiology Office Note:   ? ?Date:  06/05/2021  ? ?ID:  Kyle Gray, DOB 1960-03-10, MRN [id number] ? ?PCP:  Maris Berger, MD  ?Cardiologist:  Garwin Brothers, MD  ? ?Referring MD: Maris Berger, MD  ? ? ?ASSESSMENT:   ? ?1. Encounter for vitamin deficiency screening   ?2. Benign hypertensive heart disease with heart failure (HCC)   ?3. Mixed hyperlipidemia   ?4. Primary hypertension   ?5. Paroxysmal SVT (supraventricular tachycardia) (HCC)   ?6. Coronary artery disease involving native coronary artery of native heart without angina pectoris   ?7. Diabetes mellitus due to underlying condition with unspecified complications (HCC)   ?8. Smoking   ? ?PLAN:   ? ?In order of problems listed above: ? ?Coronary artery disease: Secondary prevention stressed with the patient.  Importance of compliance with diet medication stressed any vocalized understanding.  He was advised to walk at least half an hour a day 5 days a week and he promises to do so. ?Essential hypertension: Blood pressure stable and diet was emphasized.  Lifestyle modification urged. ?Cigarette smoker: I spent 5 minutes with the patient discussing solely about smoking. Smoking cessation was counseled. I suggested to the patient also different medications and pharmacological interventions. Patient is keen to try stopping on its own at this time. He will get back to me if he needs any further assistance in this matter. ?Essential hypertension: Blood pressure is stable and diet was emphasized. ?Mixed dyslipidemia, diabetes mellitus and obesity: Weight reduction was stressed lifestyle modification was urged and he promises to do better. ?Patient will be seen in follow-up appointment in 9 months or earlier if the patient has any concerns.  He is fasting and will have complete blood work today including a hemoglobin A1c and also vitamin D level based on his request. ? ? ? ?Medication Adjustments/Labs and Tests Ordered: ?Current medicines are reviewed at  length with the patient today.  Concerns regarding medicines are outlined above.  ?Orders Placed This Encounter  ?Procedures  ? Basic metabolic panel  ? CBC with Differential/Platelet  ? Hepatic function panel  ? Lipid panel  ? TSH  ? VITAMIN D 25 Hydroxy (Vit-D Deficiency, Fractures)  ? Hemoglobin A1c  ? ?No orders of the defined types were placed in this encounter. ? ? ? ?No chief complaint on file. ?  ? ?History of Present Illness:   ? ?Kyle Gray is a 61 y.o. male.  Patient has past medical history of coronary artery disease, carotid artery disease post stenting, essential hypertension, diabetes mellitus, dyslipidemia history of stroke.  Unfortunately he continues to smoke.  He denies any chest pain orthopnea or PND.  He leads a sedentary lifestyle.  At the time of my evaluation, the patient is alert awake oriented and in no distress. ? ?Past Medical History:  ?Diagnosis Date  ? Acute ill-defined cerebrovascular disease 04/06/2013  ? Allergy   ? Arteriosclerotic vascular disease 08/25/2019  ? Arthritis   ? Asthma   ? Per patient "mild"  ? Atopic dermatitis 06/17/2018  ? Benign hypertensive heart disease 06/11/2018  ? Benign paroxysmal positional vertigo 06/17/2018  ? Bilateral carotid artery stenosis 07/16/2016  ? CAD (coronary artery disease)   ? Carpal tunnel syndrome 05/14/2015  ? Chest discomfort 06/17/2018  ? Coughing 06/11/2018  ? CVA (cerebral vascular accident) [facility])   ? Diabetes mellitus due to underlying condition with unspecified complications (HCC) 06/17/2018  ? Diabetes mellitus, new onset (HCC) 06/11/2018  ? Essential hypertension 06/17/2018  ? GERD (  gastroesophageal reflux disease)   ? Glaucoma   ? History of cerebrovascular accident 08/25/2019  ? Hyperlipidemia 06/03/2013  ? Hypertension 04/06/2013  ? Hypokalemia 06/11/2018  ? Iliac artery aneurysm, bilateral (HCC) 07/16/2016  ? Inguinal pain of both sides 10/31/2020  ? Low back pain 05/14/2015  ? 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 Lumbar  disc herniation with radiculopathy 05/14/2015  ? Mild CAD 10/18/2014  ? luminal irregularities on angiography March 2011 luminal irregularities on angiography March 2011  ? Myocardial infarction [facility])   ? Neck pain 05/14/2015  ? Neck pain with history of cervical spinal surgery 10/08/2020  ? Non-cardiac chest pain 06/11/2018  ? Paroxysmal SVT (supraventricular tachycardia) (HCC) 10/18/2014  ? catheter ablation procedure for treatment of supraventricular tachycardia 2015 catheter ablation procedure for treatment of supraventricular tachycardia 2015  ? Peripheral vascular disease (HCC) 06/11/2018  ? Pharyngitis 06/17/2018  ? Sciatica 06/17/2018  ? Seasonal allergies   ? Shortness of breath 06/11/2018  ? Stroke [facility])   ? Thoracic or lumbosacral neuritis or radiculitis 04/06/2013  ? 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 Vertigo 06/17/2018  ? ? ?Past Surgical History:  ?Procedure Laterality Date  ? ANTERIOR CERVICAL DECOMPRESSION/DISCECTOMY FUSION 4 LEVELS N/A 10/04/2020  ? Procedure: ANTERIOR CERVICAL DECOMPRESSION FUSION CERVICAL 3- CERVICAL 4, CERVICAL 4- CERVICAL 5, CERVICAL 5- CERVICAL 6  CERVICAL 6- CERVICAL 7 WITH INSTRUMENTATION AND ALLOGRAFT;  Surgeon: Estill Bamberg, MD;  Location: [facility] OR;  Service: Orthopedics;  Laterality: N/A;  ? CARDIAC SURGERY    ? COLONOSCOPY  08/27/2015  ? Colonic polyp status post polypectomy. Small internal hemorrhoids.  ? ESOPHAGOGASTRODUODENOSCOPY  12/14/2015  ? Normal EGD. Complete healing of gastric ulcers. No Bx obtained as pt didnt stop his plavix  ? EYE SURGERY    ? HERNIA REPAIR    ? IR ANGIO INTRA EXTRACRAN SEL COM CAROTID INNOMINATE BILAT MOD SED  08/19/2019  ? IR ANGIO VERTEBRAL SEL SUBCLAVIAN INNOMINATE UNI L MOD SED  08/19/2019  ? IR ANGIO VERTEBRAL SEL VERTEBRAL UNI R MOD SED  08/19/2019  ? STENT PLACEMENT VASCULAR ([facility] HX)    ? ? ?Current Medications: ?Current Meds  ?Medication Sig  ? albuterol (VENTOLIN HFA) 108 (90 Base) MCG/ACT inhaler Inhale 1-2 puffs into the  lungs every 6 (six) hours as needed for wheezing or shortness of breath.  ? Alirocumab (PRALUENT) 150 MG/ML SOAJ Inject 150 mg into the skin every 14 (fourteen) days.  ? amLODipine (NORVASC) 10 MG tablet Take 10 mg by mouth daily.  ? cetirizine (ZYRTEC) 10 MG tablet Take 10 mg by mouth daily as needed for allergies.  ? clopidogrel (PLAVIX) 75 MG tablet Take 75 mg by mouth daily.  ? empagliflozin (JARDIANCE) 10 MG TABS tablet Take 10 mg by mouth daily.  ? folic acid (FOLVITE) 1 MG tablet Take 1 mg by mouth daily.  ? glimepiride (AMARYL) 1 MG tablet Take 1 mg by mouth as needed (for blood sugars over 150).  ? HYDROcodone-acetaminophen (NORCO) 10-325 MG tablet Take 1 tablet by mouth 4 (four) times daily as needed for pain.  ? latanoprost (XALATAN) 0.005 % ophthalmic solution Place 1 drop into both eyes at bedtime.  ? losartan-hydrochlorothiazide (HYZAAR) 100-12.5 MG tablet Take 1 tablet by mouth daily.  ? metaxalone (SKELAXIN) 800 MG tablet Take 800 mg by mouth every 8 (eight) hours as needed for muscle spasms.  ? methocarbamol (ROBAXIN) 500 MG tablet Take 1 tablet (500 mg total) by mouth every 6 (six) hours as needed for muscle spasms.  ?  metoprolol succinate (TOPROL-XL) 50 MG 24 hr tablet Take 50 mg by mouth daily.  ? montelukast (SINGULAIR) 10 MG tablet Take 10 mg by mouth daily as needed for allergies.  ? omeprazole (PRILOSEC) 20 MG capsule Take 20 mg by mouth daily as needed for heartburn.  ? potassium chloride (KLOR-CON) 10 MEQ tablet Take 10 mEq by mouth daily.  ?  ? ?Allergies:   Atorvastatin, Ketorolac, and Oxycodone  ? ?Social History  ? ?Socioeconomic History  ? Marital status: Married  ?  Spouse name: Not on file  ? Number of children: Not on file  ? Years of education: Not on file  ? Highest education level: Not on file  ?Occupational History  ? Not on file  ?Tobacco Use  ? Smoking status: Every Day  ?  Packs/day: 1.00  ?  Types: Cigarettes  ? Smokeless tobacco: Never  ?Vaping Use  ? Vaping Use: Never used   ?Substance and Sexual Activity  ? Alcohol use: Not Currently  ? Drug use: Never  ? Sexual activity: Not on file  ?Other Topics Concern  ? Not on file  ?Social History Narrative  ? Not on file  ? ?Social

## 2021-06-05 NOTE — Patient Instructions (Addendum)
Medication Instructions:  °Your physician recommends that you continue on your current medications as directed. Please refer to the Current Medication list given to you today. ° °*If you need a refill on your cardiac medications before your next appointment, please call your pharmacy* ° ° °Lab Work: °Your physician recommends that you have labs done in the office today. Your test included  basic metabolic panel, complete blood count, TSH, vitamin D, hgb A1C,liver function and lipids. ° °If you have labs (blood work) drawn today and your tests are completely normal, you will receive your results only by: °MyChart Message (if you have MyChart) OR °A paper copy in the mail °If you have any lab test that is abnormal or we need to change your treatment, we will call you to review the results. ° ° °Testing/Procedures: °None ordered ° ° °Follow-Up: °At [facility], you and your health needs are our priority.  As part of our continuing mission to provide you with exceptional heart care, we have created designated Provider Care Teams.  These Care Teams include your primary Cardiologist (physician) and Advanced Practice Providers (APPs -  Physician Assistants and Nurse Practitioners) who all work together to provide you with the care you need, when you need it. ° °We recommend signing up for the patient portal called "MyChart".  Sign up information is provided on this After Visit Summary.  MyChart is used to connect with patients for Virtual Visits (Telemedicine).  Patients are able to view lab/test results, encounter notes, upcoming appointments, etc.  Non-urgent messages can be sent to your provider as well.   °To learn more about what you can do with MyChart, go to [email or web address].   ° °Your next appointment:   °9 month(s) ° °The format for your next appointment:   °In Person ° °Provider:   °Rajan Revankar, MD ° ° °Other Instructions °NA   °

## 2021-06-06 LAB — BASIC METABOLIC PANEL
BUN/Creatinine Ratio: 5 — ABNORMAL LOW (ref 10–24)
BUN: 5 mg/dL — ABNORMAL LOW (ref 8–27)
CO2: 29 mmol/L (ref 20–29)
Calcium: 9.7 mg/dL (ref 8.6–10.2)
Chloride: 97 mmol/L (ref 96–106)
Creatinine, Ser: 1.08 mg/dL (ref 0.76–1.27)
Glucose: 242 mg/dL — ABNORMAL HIGH (ref 70–99)
Potassium: 3.4 mmol/L — ABNORMAL LOW (ref 3.5–5.2)
Sodium: 138 mmol/L (ref 134–144)
eGFR: 78 mL/min/{1.73_m2} (ref >59)

## 2021-06-06 LAB — LIPID PANEL
Chol/HDL Ratio: 2.7 ratio (ref 0.0–5.0)
Cholesterol, Total: 105 mg/dL (ref 100–199)
HDL: 39 mg/dL — ABNORMAL LOW (ref >39)
LDL Chol Calc (NIH): 40 mg/dL (ref 0–99)
Triglycerides: 150 mg/dL — ABNORMAL HIGH (ref 0–149)
VLDL Cholesterol Cal: 26 mg/dL (ref 5–40)

## 2021-06-06 LAB — HEPATIC FUNCTION PANEL
ALT: 16 IU/L (ref 0–44)
AST: 21 IU/L (ref 0–40)
Albumin: 4.3 g/dL (ref 3.8–4.8)
Alkaline Phosphatase: 130 IU/L — ABNORMAL HIGH (ref 44–121)
Bilirubin Total: 0.7 mg/dL (ref 0.0–1.2)
Bilirubin, Direct: 0.22 mg/dL (ref 0.00–0.40)
Total Protein: 6.8 g/dL (ref 6.0–8.5)

## 2021-06-06 LAB — CBC WITH DIFFERENTIAL/PLATELET
Basophils Absolute: 0.1 10*3/uL (ref 0.0–0.2)
Basos: 1 %
EOS (ABSOLUTE): 0.2 10*3/uL (ref 0.0–0.4)
Eos: 4 %
Hematocrit: 46.1 % (ref 37.5–51.0)
Hemoglobin: 15.4 g/dL (ref 13.0–17.7)
Immature Grans (Abs): 0 10*3/uL (ref 0.0–0.1)
Immature Granulocytes: 0 %
Lymphocytes Absolute: 1.9 10*3/uL (ref 0.7–3.1)
Lymphs: 45 %
MCH: 28.6 pg (ref 26.6–33.0)
MCHC: 33.4 g/dL (ref 31.5–35.7)
MCV: 86 fL (ref 79–97)
Monocytes Absolute: 0.4 10*3/uL (ref 0.1–0.9)
Monocytes: 9 %
Neutrophils Absolute: 1.8 10*3/uL (ref 1.4–7.0)
Neutrophils: 41 %
Platelets: 205 10*3/uL (ref 150–450)
RBC: 5.38 x10E6/uL (ref 4.14–5.80)
RDW: 11.8 % (ref 11.6–15.4)
WBC: 4.3 10*3/uL (ref 3.4–10.8)

## 2021-06-06 LAB — TSH: TSH: 1.85 u[IU]/mL (ref 0.450–4.500)

## 2021-06-06 LAB — VITAMIN D 25 HYDROXY (VIT D DEFICIENCY, FRACTURES): Vit D, 25-Hydroxy: 15.3 ng/mL — ABNORMAL LOW (ref 30.0–100.0)

## 2021-06-06 LAB — HEMOGLOBIN A1C
Est. average glucose Bld gHb Est-mCnc: 123 mg/dL
Hgb A1c MFr Bld: 5.9 % — ABNORMAL HIGH (ref 4.8–5.6)

## 2021-08-14 ENCOUNTER — Other Ambulatory Visit: Payer: Self-pay | Admitting: Orthopedic Surgery

## 2021-08-14 ENCOUNTER — Telehealth: Payer: Self-pay

## 2021-08-14 NOTE — Telephone Encounter (Signed)
Primary Cardiologist:Rajan R Revankar, MD   Preoperative team, please contact this patient and set up a phone call appointment for further preoperative risk assessment. Please obtain consent and complete medication review. Thank you for your help.   No request to hold medications. Antiplatelet is not managed by cardiology.   Levi Aland, NP-C    08/14/2021, 4:55 PM [facility] [address], [address] Office [phone] Fax [phone]

## 2021-08-14 NOTE — Telephone Encounter (Signed)
   Pre-operative Risk Assessment    Patient Name: Kyle Gray  DOB: March 09, 1960 MRN: [id number]      Request for Surgical Clearance    Procedure:   Lumbar 5- Sacrum 1 Revision Posterior Spinal Fusion with revision of posterior instrumentation  Date of Surgery:  Clearance 09/11/21                                 Surgeon:  Dr. Estill Bamberg Surgeon's Group or Practice Name:  [facility] Phone number:  [phone] Fax number:  [phone] Attention Tobey Bride   Type of Clearance Requested:   - Medical    Type of Anesthesia:  Not Indicated   Additional requests/questions:    SignedDione Housekeeper   08/14/2021, 3:46 PM

## 2021-08-15 ENCOUNTER — Telehealth: Payer: Self-pay | Admitting: *Deleted

## 2021-08-15 NOTE — Telephone Encounter (Signed)
DPR ok to s/w the pt's wife. Pt's wife called in and she said pt was at work still, but she had him on the call as well. Pt and his wife are agreeable to plan of care for tele visit pre op appt 08/23/21 @ 10:20 am. Med rec and consent are done.

## 2021-08-15 NOTE — Telephone Encounter (Signed)
DPR ok to s/w the pt's wife. Pt's wife called in and she said pt was at work still, but she had him on the call as well. Pt and his wife are agreeable to plan of care for tele visit pre op appt 08/23/21 @ 10:20 am. Med rec and consent are done.      Patient Consent for Virtual Visit        Kyle Gray has provided verbal consent on 08/15/2021 for a virtual visit (video or telephone).   CONSENT FOR VIRTUAL VISIT FOR:  Kyle Gray  By participating in this virtual visit I agree to the following:  I hereby voluntarily request, consent and authorize [facility] and its employed or contracted physicians, physician assistants, nurse practitioners or other licensed health care professionals (the Practitioner), to provide me with telemedicine health care services (the "Services") as deemed necessary by the treating Practitioner. I acknowledge and consent to receive the Services by the Practitioner via telemedicine. I understand that the telemedicine visit will involve communicating with the Practitioner through live audiovisual communication technology and the disclosure of certain medical information by electronic transmission. I acknowledge that I have been given the opportunity to request an in-person assessment or other available alternative prior to the telemedicine visit and am voluntarily participating in the telemedicine visit.  I understand that I have the right to withhold or withdraw my consent to the use of telemedicine in the course of my care at any time, without affecting my right to future care or treatment, and that the Practitioner or I may terminate the telemedicine visit at any time. I understand that I have the right to inspect all information obtained and/or recorded in the course of the telemedicine visit and may receive copies of available information for a reasonable fee.  I understand that some of the potential risks of receiving the Services via telemedicine include:   Delay or interruption in medical evaluation due to technological equipment failure or disruption; Information transmitted may not be sufficient (e.g. poor resolution of images) to allow for appropriate medical decision making by the Practitioner; and/or  In rare instances, security protocols could fail, causing a breach of personal health information.  Furthermore, I acknowledge that it is my responsibility to provide information about my medical history, conditions and care that is complete and accurate to the best of my ability. I acknowledge that Practitioner's advice, recommendations, and/or decision may be based on factors not within their control, such as incomplete or inaccurate data provided by me or distortions of diagnostic images or specimens that may result from electronic transmissions. I understand that the practice of medicine is not an exact science and that Practitioner makes no warranties or guarantees regarding treatment outcomes. I acknowledge that a copy of this consent can be made available to me via my patient portal [facility] MyChart), or I can request a printed copy by calling the office of [facility].    I understand that my insurance will be billed for this visit.   I have read or had this consent read to me. I understand the contents of this consent, which adequately explains the benefits and risks of the Services being provided via telemedicine.  I have been provided ample opportunity to ask questions regarding this consent and the Services and have had my questions answered to my satisfaction. I give my informed consent for the services to be provided through the use of telemedicine in my medical care

## 2021-08-15 NOTE — Telephone Encounter (Signed)
Left message for the pt to call back for a  tele visit for pre op clearance.  

## 2021-08-23 ENCOUNTER — Telehealth: Payer: Self-pay | Admitting: Cardiology

## 2021-08-23 ENCOUNTER — Encounter: Payer: BC Managed Care – PPO | Admitting: Nurse Practitioner

## 2021-08-23 NOTE — Telephone Encounter (Signed)
Pt called stating he missed his preop tele visit appt. He states that he may not answer the phone when you call back but he is open to any date just not this upcoming Monday 08/26/21 because he has another appt that day. He also ask that you leave a detailed message

## 2021-08-23 NOTE — Progress Notes (Signed)
This encounter was created in error - please disregard.

## 2021-08-23 NOTE — Telephone Encounter (Signed)
Please see 08/14/21 clearance notes for further information

## 2021-08-23 NOTE — Telephone Encounter (Signed)
I have rescheduled the pt for his tele pre op visit that he missed today. The pre op provider did try to call him back this afternoon as well, though pt did not answer his phone.  See notes from the pt that he may not answer his phone and we can leave a detailed message with the new appt. Any date was fine per the pt message except 08/26/21.   I have scheduled the pt for 09/02/21 @ 2 pm. I will call the pt and if I get his vm I will leave a detailed message with there appt. I will update the requesting office as well.

## 2021-09-02 ENCOUNTER — Ambulatory Visit (INDEPENDENT_AMBULATORY_CARE_PROVIDER_SITE_OTHER): Payer: BC Managed Care – PPO | Admitting: Nurse Practitioner

## 2021-09-02 DIAGNOSIS — Z01810 Encounter for preprocedural cardiovascular examination: Secondary | ICD-10-CM | POA: Diagnosis not present

## 2021-09-02 NOTE — Progress Notes (Signed)
Virtual Visit via Telephone Note   Because of Kyle Gray's co-morbid illnesses, he is at least at moderate risk for complications without adequate follow up.  This format is felt to be most appropriate for this patient at this time.  The patient did not have access to video technology/had technical difficulties with video requiring transitioning to audio format only (telephone).  All issues noted in this document were discussed and addressed.  No physical exam could be performed with this format.  Please refer to the patient's chart for his consent to telehealth for [facility].  Evaluation Performed:  Preoperative cardiovascular risk assessment _____________   Date:  09/02/2021   Patient ID:  Kyle Gray, DOB 07-14-1960, MRN [id number] Patient Location:  Home Provider location:   Office  Primary Care Provider:  Maris Berger, MD Primary Cardiologist:  Garwin Brothers, MD  Chief Complaint / Patient Profile   61 y.o. y/o male with a h/o HTN, HLD, paroxysmal SVT, CAD, DM, tobacco abuse, CHF, CVA who is pending  Lumbar 5- Sacrum 1 Revision Posterior Spinal Fusion with revision of posterior instrumentation and presents today for telephonic preoperative cardiovascular risk assessment.  Past Medical History    Past Medical History:  Diagnosis Date   Acute ill-defined cerebrovascular disease 04/06/2013   Allergy    Arteriosclerotic vascular disease 08/25/2019   Arthritis    Asthma    Per patient "mild"   Atopic dermatitis 06/17/2018   Benign hypertensive heart disease 06/11/2018   Benign paroxysmal positional vertigo 06/17/2018   Bilateral carotid artery stenosis 07/16/2016   CAD (coronary artery disease)    Carpal tunnel syndrome 05/14/2015   Chest discomfort 06/17/2018   Coughing 06/11/2018   CVA (cerebral vascular accident) (HCC)    Diabetes mellitus due to underlying condition with unspecified complications (HCC) 06/17/2018   Diabetes mellitus, new onset (HCC)  06/11/2018   Essential hypertension 06/17/2018   GERD (gastroesophageal reflux disease)    Glaucoma    History of cerebrovascular accident 08/25/2019   Hyperlipidemia 06/03/2013   Hypertension 04/06/2013   Hypokalemia 06/11/2018   Iliac artery aneurysm, bilateral (HCC) 07/16/2016   Inguinal pain of both sides 10/31/2020   Low back pain 05/14/2015   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   Mild CAD 10/18/2014   luminal irregularities on angiography March 2011 luminal irregularities on angiography March 2011   Myocardial infarction [facility])    Neck pain 05/14/2015   Neck pain with history of cervical spinal surgery 10/08/2020   Non-cardiac chest pain 06/11/2018   Paroxysmal SVT (supraventricular tachycardia) (HCC) 10/18/2014   catheter ablation procedure for treatment of supraventricular tachycardia 2015 catheter ablation procedure for treatment of supraventricular tachycardia 2015   Peripheral vascular disease (HCC) 06/11/2018   Pharyngitis 06/17/2018   Sciatica 06/17/2018   Seasonal allergies    Shortness of breath 06/11/2018   Stroke [facility])    Thoracic or lumbosacral neuritis or radiculitis 04/06/2013   Vertebral artery occlusion 05/14/2015   Vertigo 06/17/2018   Past Surgical History:  Procedure Laterality Date   ANTERIOR CERVICAL DECOMPRESSION/DISCECTOMY FUSION 4 LEVELS N/A 10/04/2020   Procedure: ANTERIOR CERVICAL DECOMPRESSION FUSION CERVICAL 3- CERVICAL 4, CERVICAL 4- CERVICAL 5, CERVICAL 5- CERVICAL 6  CERVICAL 6- CERVICAL 7 WITH INSTRUMENTATION AND ALLOGRAFT;  Surgeon: Estill Bamberg, MD;  Location: [facility] OR;  Service: Orthopedics;  Laterality: N/A;   CARDIAC SURGERY     COLONOSCOPY  08/27/2015   Colonic polyp status post polypectomy. Small internal hemorrhoids.  ESOPHAGOGASTRODUODENOSCOPY  12/14/2015   Normal EGD. Complete healing of gastric ulcers. No Bx obtained as pt didnt stop his plavix   EYE SURGERY     HERNIA REPAIR      IR ANGIO INTRA EXTRACRAN SEL COM CAROTID INNOMINATE BILAT MOD SED  08/19/2019   IR ANGIO VERTEBRAL SEL SUBCLAVIAN INNOMINATE UNI L MOD SED  08/19/2019   IR ANGIO VERTEBRAL SEL VERTEBRAL UNI R MOD SED  08/19/2019   STENT PLACEMENT VASCULAR ([facility] HX)      Allergies  Allergies  Allergen Reactions   Atorvastatin     Myalgias (intolerance)   Ketorolac     Reaction unknown   Oxycodone Nausea And Vomiting    After one pill patient throws up      History of Present Illness    Kyle Gray is a 61 y.o. male who presents via audio/video conferencing for a telehealth visit today.  Pt was last seen in cardiology clinic on 06/05/2021 by Dr Tomie China.  At that time Kyle Gray was doing well with no cardiovascular complaints with stable blood pressure. The patient is now pending procedure as outlined above. Since his last visit, he has no cardiac complaints at this time.  He is tolerating his medications with no side effects.   Home Medications    Prior to Admission medications   Medication Sig Start Date End Date Taking? Authorizing Provider  albuterol (VENTOLIN HFA) 108 (90 Base) MCG/ACT inhaler Inhale 1-2 puffs into the lungs every 6 (six) hours as needed for wheezing or shortness of breath.    [provider]  Alirocumab (PRALUENT) 150 MG/ML SOAJ Inject 150 mg into the skin every 14 (fourteen) days.    [provider]  amLODipine (NORVASC) 10 MG tablet Take 10 mg by mouth daily. 04/16/18   [provider]  cetirizine (ZYRTEC) 10 MG tablet Take 10 mg by mouth daily as needed for allergies. 04/17/18   [provider]  cholecalciferol (VITAMIN D3) 25 MCG (1000 UNIT) tablet Take 1,000 Units by mouth daily.    [provider]  clopidogrel (PLAVIX) 75 MG tablet Take 75 mg by mouth daily.    [provider]  empagliflozin (JARDIANCE) 10 MG TABS tablet Take 10 mg by mouth daily.    [provider]  folic acid (FOLVITE) 1 MG tablet  Take 1 mg by mouth daily.    [provider]  glimepiride (AMARYL) 1 MG tablet Take 1 mg by mouth daily as needed (for blood sugars over 150). 09/19/20   [provider]  HYDROcodone-acetaminophen (NORCO) 10-325 MG tablet Take 1 tablet by mouth 4 (four) times daily as needed for pain. 05/10/21   [provider]  latanoprost (XALATAN) 0.005 % ophthalmic solution Place 1 drop into both eyes at bedtime.    [provider]  losartan-hydrochlorothiazide (HYZAAR) 100-12.5 MG tablet Take 1 tablet by mouth daily.    [provider]  metaxalone (SKELAXIN) 800 MG tablet Take 800 mg by mouth every 8 (eight) hours as needed for muscle spasms. 07/19/20   [provider]  methocarbamol (ROBAXIN) 500 MG tablet Take 1 tablet (500 mg total) by mouth every 6 (six) hours as needed for muscle spasms. 10/04/20   McKenzie, Eilene Ghazi, PA-C  metoprolol succinate (TOPROL-XL) 50 MG 24 hr tablet Take 50 mg by mouth daily. 05/30/18   [provider]  montelukast (SINGULAIR) 10 MG tablet Take 10 mg by mouth daily as needed for allergies. 03/15/21   [provider]  omeprazole (PRILOSEC) 20 MG capsule Take 20 mg by mouth daily as needed for heartburn. 04/12/20   [provider]  potassium chloride (KLOR-CON) 10 MEQ tablet Take 10 mEq by mouth daily. 05/21/21   [provider]    Physical Exam    Vital Signs:  Kyle Gray does not have vital signs available for review today.  None  Given telephonic nature of communication, physical exam is limited. AAOx3. NAD. Normal affect.  Speech and respirations are unlabored.  Accessory Clinical Findings    None  Assessment & Plan    1.  Preoperative Cardiovascular Risk Assessment: :[id number] Kyle Gray perioperative risk of a major cardiac event is 0.9% according to the Revised Cardiac Risk Index (RCRI).  Therefore, he is at low risk for perioperative complications.   His functional capacity is  fair at 4.64 METs according to the [place] Activity Status Index (DASI). Recommendations: According to ACC/AHA guidelines, no further cardiovascular testing needed.  The patient may proceed to surgery at acceptable risk.   Antiplatelet and/or Anticoagulation Recommendations:   Antiplatelet is not managed by cardiology  A copy of this note will be routed to requesting surgeon.  Time:   Today, I have spent 12 minutes with the patient with telehealth technology discussing medical history, symptoms, and management plan.     Mable Fill, Marissa Nestle, NP  09/02/2021, 8:39 AM

## 2021-09-04 ENCOUNTER — Other Ambulatory Visit (HOSPITAL_COMMUNITY): Payer: BC Managed Care – PPO

## 2021-09-11 ENCOUNTER — Inpatient Hospital Stay (HOSPITAL_COMMUNITY)
Admission: RE | Admit: 2021-09-11 | Payer: BC Managed Care – PPO | Source: Home / Self Care | Admitting: Orthopedic Surgery

## 2021-09-11 ENCOUNTER — Encounter (HOSPITAL_COMMUNITY): Admission: RE | Payer: Self-pay | Source: Home / Self Care

## 2021-09-11 SURGERY — POSTERIOR LUMBAR FUSION 1 LEVEL
Anesthesia: General

## 2021-11-13 ENCOUNTER — Other Ambulatory Visit (HOSPITAL_COMMUNITY): Payer: Self-pay | Admitting: Interventional Radiology

## 2021-11-13 DIAGNOSIS — I771 Stricture of artery: Secondary | ICD-10-CM

## 2021-11-18 ENCOUNTER — Ambulatory Visit (HOSPITAL_COMMUNITY)
Admission: RE | Admit: 2021-11-18 | Discharge: 2021-11-18 | Disposition: A | Discharge status: Home / Self Care | Payer: BC Managed Care – PPO | Source: Ambulatory Visit | Attending: Interventional Radiology | Admitting: Interventional Radiology

## 2021-11-18 DIAGNOSIS — I771 Stricture of artery: Secondary | ICD-10-CM | POA: Diagnosis present

## 2021-11-18 LAB — POCT I-STAT CREATININE: Creatinine, Ser: 0.9 mg/dL (ref 0.61–1.24)

## 2021-11-18 MED ORDER — IOHEXOL 350 MG/ML SOLN
75.0000 mL | Freq: Once | INTRAVENOUS | Status: AC | PRN
  Administered 2021-11-18: 75 mL via INTRAVENOUS

## 2021-11-20 ENCOUNTER — Telehealth (HOSPITAL_COMMUNITY): Payer: Self-pay

## 2021-11-20 NOTE — Telephone Encounter (Signed)
Pt agreed to f/u in 1 year with a cta head/neck. AW  

## 2022-03-21 IMAGING — CT CT L SPINE W/O CM
1 of 6 series · 7 of 14 positions shown, 9 images · non-contrast
Comparison: Lumbar MRI 12/06/2020. CTA Abdomen and Pelvis
07/31/2018.

CLINICAL DATA: 60-year-old male with low back pain. Right leg
weakness numbness and tingling. Prior surgery.

EXAM:
CT LUMBAR SPINE WITHOUT CONTRAST
TECHNIQUE: Multidetector CT imaging of the lumbar spine was performed without
intravenous contrast administration. Multiplanar CT image
reconstructions were also generated.

[Series 3: l spine soft (person_name) · axial · 0.34mm/px · z∈[-310,-110]mm · 7 of 134 slices shown, 9 images]
[im 17/134  soft-tissue]
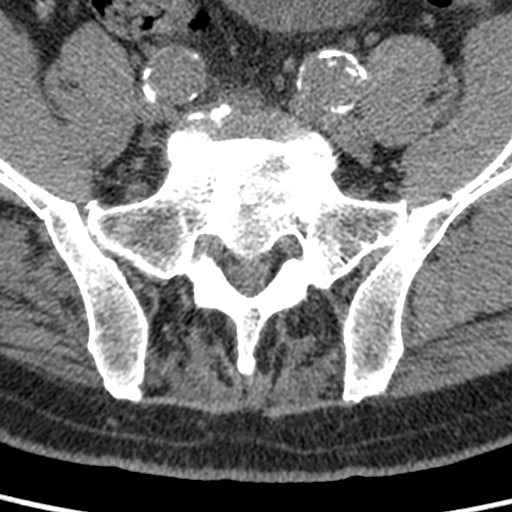
[im 17/134  bone]
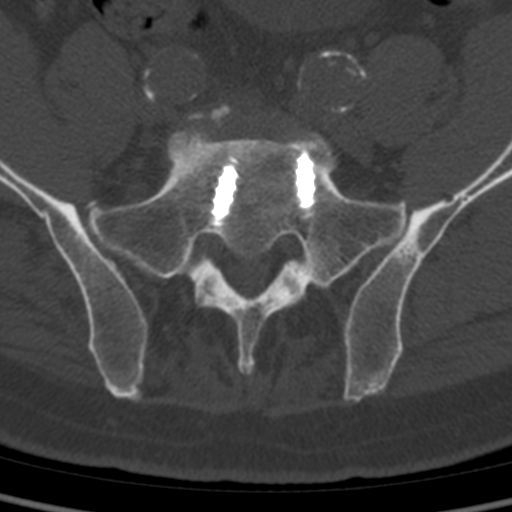
[im 34/134  bone]
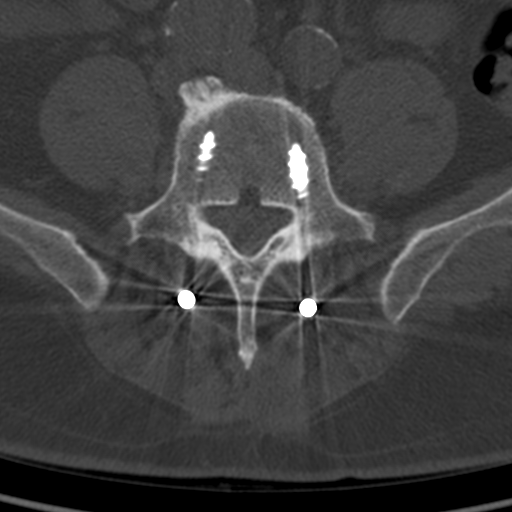
[im 50/134  bone]
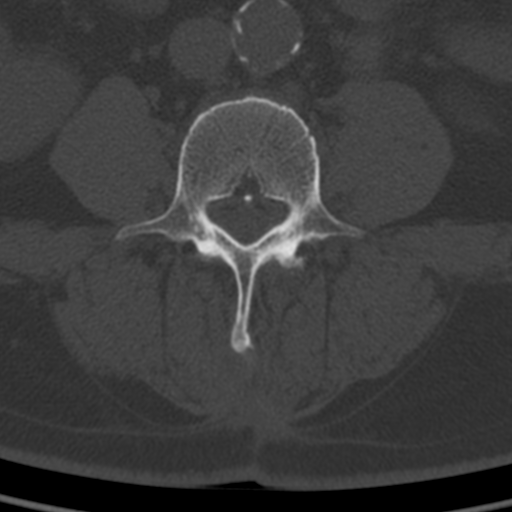
[im 67/134  bone]
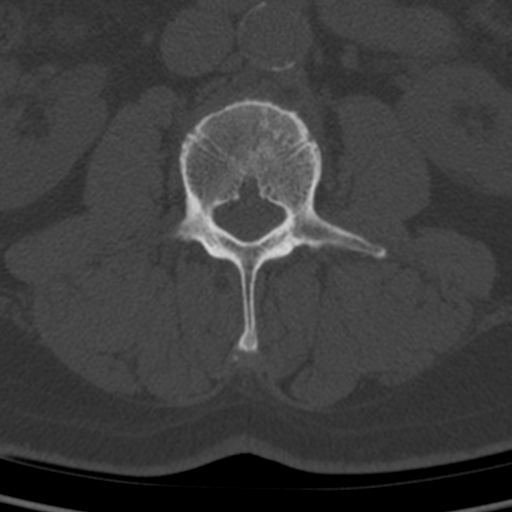
[im 84/134  soft-tissue]
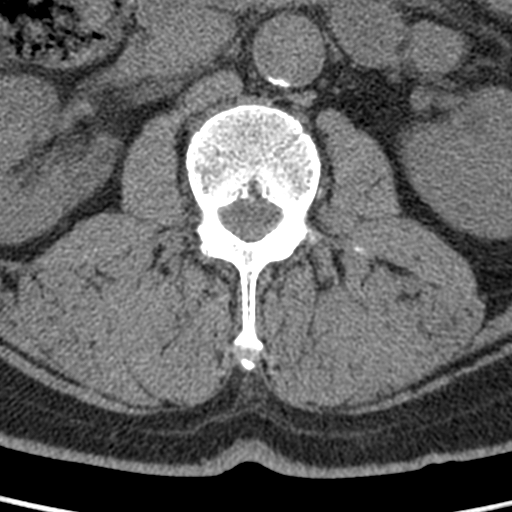
[im 84/134  bone]
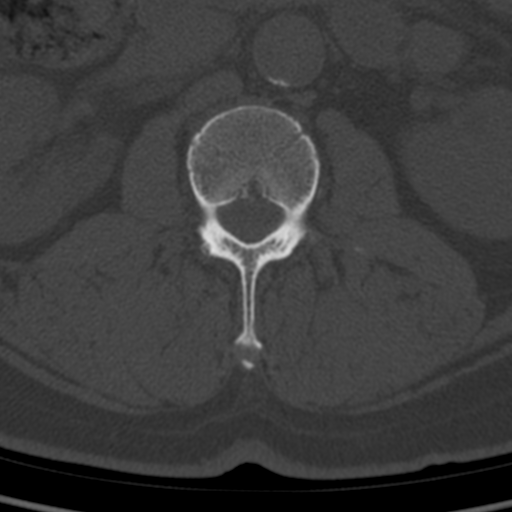
[im 100/134  bone]
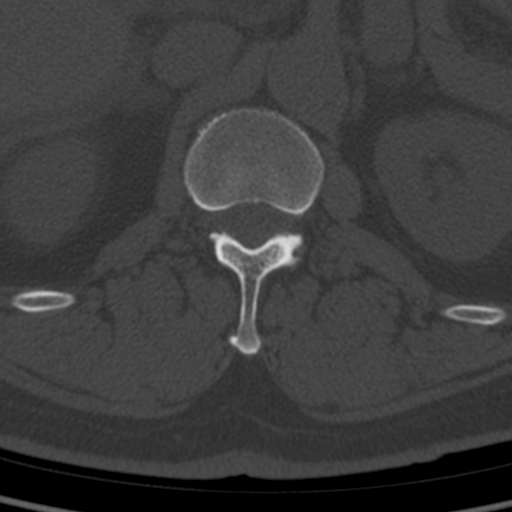
[im 117/134  bone]
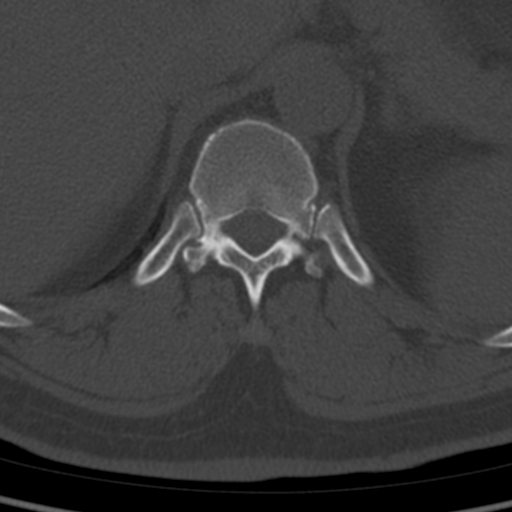

[7 of 14 positions shown; findings below may reference images not displayed]

FINDINGS: Segmentation: Normal, the same numbering system on the [facility] MRI.

Alignment: Straightening of lumbar lordosis has not significantly
changed since 3535.

Vertebrae: Postoperative details are below. Sclerosis of the
inferior L3 body is new since 3535 and appears associated with
inferior endplate Schmorl's node with faint marrow edema on the
[facility] MRI (series 8, image 42 today). Normal background bone
mineralization. No other No acute osseous abnormality identified.
Intact visible sacrum. Chronic sclerotic SI joint degeneration is
stable since 3535.

Paraspinal and other soft tissues: Tortuous abdominal aorta and
iliac arteries with calcified atherosclerosis. Common iliac artery
fusiform enlargement up to 22 mm is stable since the 3535 CTA.
Negative for abdominal aortic aneurysm. Partially visible distended
urinary bladder. No hydronephrosis or hydroureter. Normal appendix.
Negative visible costophrenic angles. Lumbar paraspinal soft tissues
are negative aside from postoperative changes detailed below.

Disc levels: T11-T12: Disc space loss with mild circumferential disc
bulge and up to moderate facet hypertrophy greater on the right.
Questionable mild spinal stenosis at this level (series 3, image 9).

T12-L1:  Stable, negative.

L1-L2:  Stable.  Negative disc.  Mild facet hypertrophy.

L2-L3: Mild disc space loss with circumferential disc bulge. Mild
facet hypertrophy. Borderline to mild spinal stenosis and left
greater than right foraminal stenosis appears stable.

L3-L4: Disc space loss with circumferential disc bulge. Mild
posterior element hypertrophy. Borderline to mild spinal stenosis
and left greater than right foraminal stenosis appears stable.

L4-L5: Disc space loss with circumferential disc bulging. Mild facet
and mild to moderate ligament flavum hypertrophy. Some hardware
artifact limiting detail of the thecal sac at this level. But this
level appears stable from the MRI last month.

L5-S1: Prior decompression and fusion. Bilateral L5 pedicle screws
without evidence of loosening. Bilateral S1 pedicle screws, although
the right S1 screw traverses the lateral recess on series 4, image
116 and series 7, image 37. No evidence of loosening. Interbody
implant. Small volume interbody vacuum phenomena. Residual
circumferential disc osteophyte complex. No spinal or foraminal
stenosis.
IMPRESSION: 1. L5-S1 decompression and fusion. No interbody arthrodesis, trace
interbody vacuum phenomena. Right S1 pedicle screw traverses the
course of the right S1 nerve.

2. Mild adjacent segment disease at L4-L5 with disc bulging and
posterior element hypertrophy appears stable from the MRI last
month.

3. Sclerosis of the inferior L3 body is new since 3535 and appears
degenerative, likely associated with an inferior endplate Schmorl's
node. No other acute osseous abnormality. Chronic SI joint
degeneration.

4. Lumbar spine degeneration elsewhere appears stable from the
[facility] MRI, with up to mild spinal and left foraminal stenosis at
L2-L3 and L3-L4. And possible mild multifactorial lower thoracic
spinal stenosis at T11-T12.

5. Aortic Atherosclerosis ([id number]).

## 2022-03-25 ENCOUNTER — Ambulatory Visit: Payer: BC Managed Care – PPO | Attending: Cardiology | Admitting: Cardiology

## 2022-03-25 ENCOUNTER — Encounter: Payer: Self-pay | Admitting: Cardiology

## 2022-03-25 VITALS — BP 128/78 | HR 63 | Ht 68.0 in | Wt 225.2 lb

## 2022-03-25 DIAGNOSIS — E782 Mixed hyperlipidemia: Secondary | ICD-10-CM | POA: Diagnosis not present

## 2022-03-25 DIAGNOSIS — I10 Essential (primary) hypertension: Secondary | ICD-10-CM

## 2022-03-25 DIAGNOSIS — I2510 Atherosclerotic heart disease of native coronary artery without angina pectoris: Secondary | ICD-10-CM | POA: Diagnosis not present

## 2022-03-25 DIAGNOSIS — E088 Diabetes mellitus due to underlying condition with unspecified complications: Secondary | ICD-10-CM | POA: Diagnosis not present

## 2022-03-25 DIAGNOSIS — F17200 Nicotine dependence, unspecified, uncomplicated: Secondary | ICD-10-CM

## 2022-03-25 NOTE — Progress Notes (Signed)
Cardiology Office Note:    Date:  03/25/2022   ID:  Kyle Gray, DOB 08/25/1960, MRN [id number]  PCP:  Gala Lewandowsky, MD  Cardiologist:  Jenean Lindau, MD   Referring MD: Gala Lewandowsky, MD    ASSESSMENT:    1. Coronary artery disease involving native coronary artery of native heart without angina pectoris   2. Essential hypertension   3. Diabetes mellitus due to underlying condition with unspecified complications ([place])   4. Mixed hyperlipidemia   5. Smoking    PLAN:    In order of problems listed above:  Coronary artery disease: Secondary prevention stressed with the patient.  Importance of compliance with diet medication stressed and he vocalized understanding.  He was advised to walk at least half an hour a day 5 days a week and he promises to do so.  He is active at his work with no symptoms. Essential hypertension: Blood pressure stable and diet was emphasized. Mixed dyslipidemia: On lipid-lowering medications followed by primary care.  Diet emphasized. Cigarette smoker: I spent 5 minutes with the patient discussing solely about smoking. Smoking cessation was counseled. I suggested to the patient also different medications and pharmacological interventions. Patient is keen to try stopping on its own at this time. He will get back to me if he needs any further assistance in this matter. Hypokalemia: We will check his Chem-7 today.  We will check potassium as Hospital records reviewed revealed hypokalemia and he has history of hypokalemia. .[id number]   Medication Adjustments/Labs and Tests Ordered: Current medicines are reviewed at length with the patient today.  Concerns regarding medicines are outlined above.  No orders of the defined types were placed in this encounter.  No orders of the defined types were placed in this encounter.    No chief complaint on file.    History of Present Illness:    Kyle Krieteis a 62y.o. male.  Patient has past  medical history of coronary artery disease, essential hypertension, mixed dyslipidemia and diabetes mellitus.  Unfortunately continues to smoke.  He denies any chest pain orthopnea or PND.  He was recently treated with a round of antibiotics.  At the time of my evaluation, the patient is alert awake oriented and in no distress.  Past Medical History:  Diagnosis Date   Acute ill-defined cerebrovascular disease 04/06/2013   Allergy    Arteriosclerotic vascular disease 08/25/2019   Arthritis    Asthma    Per patient "mild"   Atopic dermatitis 06/17/2018   Benign hypertensive heart disease 06/11/2018   Benign paroxysmal positional vertigo 06/17/2018   Bilateral carotid artery stenosis 07/16/2016   CAD (coronary artery disease)    Carpal tunnel syndrome 05/14/2015   Chest discomfort 06/17/2018   Coughing 06/11/2018   CVA (cerebral vascular accident) ([place]    Diabetes mellitus due to underlying condition with unspecified complications ([facility] [id number]  Diabetes mellitus, new onset ([place] 06/11/2018   Essential hypertension 06/17/2018   GERD (gastroesophageal reflux disease)    Glaucoma    History of cerebrovascular accident 08/25/2019   Hyperlipidemia 06/03/2013   Hypertension 04/06/2013   Hypokalemia 06/11/2018   Iliac artery aneurysm, bilateral ([place] 07/16/2016   Inguinal pain of both sides 10/31/2020   Low back pain 05/14/2015   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   Mild CAD 10/18/2014   luminal irregularities on angiography March 2011 luminal irregularities on angiography March 2011   Myocardial infarction ([facility]    Neck  pain 05/14/2015   Neck pain with history of cervical spinal surgery 10/08/2020   Non-cardiac chest pain 06/11/2018   Paroxysmal SVT (supraventricular tachycardia) 10/18/2014   catheter ablation procedure for treatment of supraventricular tachycardia 2015 catheter ablation procedure for treatment of  supraventricular tachycardia 2015   Peripheral vascular disease ([place]) 06/11/2018   Pharyngitis 06/17/2018   Sciatica 06/17/2018   Seasonal allergies    Shortness of breath 06/11/2018   Smoking 06/05/2021   Stroke [facility])    Thoracic or lumbosacral neuritis or radiculitis 04/06/2013   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 Small internal hemorrhoids.   ESOPHAGOGASTRODUODENOSCOPY  12/14/2015   Normal EGD. Complete healing of gastric ulcers. No Bx obtained as pt didnt stop his plavix   EYE SURGERY     HERNIA REPAIR     IR ANGIO INTRA EXTRACRAN SEL COM CAROTID INNOMINATE BILAT MOD SED  08/19/2019   IR ANGIO VERTEBRAL SEL SUBCLAVIAN INNOMINATE UNI L MOD SED  08/19/2019   IR ANGIO VERTEBRAL SEL VERTEBRAL UNI R MOD SED  08/19/2019   STENT PLACEMENT VASCULAR ([facility] HX)      Current Medications: Current Meds  Medication Sig   albuterol (VENTOLIN HFA) 108 (90 Base) MCG/ACT inhaler Inhale 1-2 puffs into the lungs every 6 (six) hours as needed for wheezing or shortness of breath.   amLODipine (NORVASC) 10 MG tablet Take 10 mg by mouth daily.   cetirizine (ZYRTEC) 10 MG tablet Take 10 mg by mouth daily as needed for allergies.   clopidogrel (PLAVIX) 75 MG tablet Take 75 mg by mouth daily.   empagliflozin (JARDIANCE) 10 MG TABS tablet Take 10 mg by mouth daily.   folic acid (FOLVITE) 1 MG tablet Take 1 mg by mouth daily.   glimepiride (AMARYL) 1 MG tablet Take 1 mg by mouth daily as needed (for  blood sugars over 150).   HYDROcodone-acetaminophen (NORCO) 10-325 MG tablet Take 1 tablet by mouth 4 (four) times daily as needed for pain.   latanoprost (XALATAN) 0.005 % ophthalmic solution Place 1 drop into both eyes at bedtime.   losartan-hydrochlorothiazide (HYZAAR) 100-12.5 MG tablet Take 1 tablet by mouth daily.   metoprolol succinate (TOPROL-XL) 50 MG 24 hr tablet Take 50 mg by mouth daily.   montelukast (SINGULAIR) 10 MG tablet Take 10 mg by mouth daily as needed for allergies.   pregabalin (LYRICA) 100 MG capsule Take 100 mg by mouth 3 (three) times daily.   PULMICORT FLEXHALER 90 MCG/ACT inhaler Inhale 1-2 puffs into the lungs 2 (two) times daily.   [place] 420 MG/3.5ML SOCT Inject 420 mg into the skin every 30 (thirty) days.     Allergies:   Atorvastatin, Ketorolac, and Oxycodone   Social History   Socioeconomic History   Marital status: Married    Spouse name: Not on file   Number of children: Not on file   Years of education: Not on file   Highest education level: Not on file  Occupational History   Not on file  Tobacco  Use   Smoking status: Every Day    Packs/day: 1.00    Types: Cigarettes   Smokeless tobacco: Never  Vaping Use   Vaping Use: Never used  Substance and Sexual Activity   Alcohol use: Not Currently   Drug use: Never   Sexual activity: Not on file  Other Topics Concern   Not on file  Social History Narrative   Not on file   Social Determinants of Health   Financial Resource Strain: Not on file  Food Insecurity: Not on file  Transportation Needs: Not on file  Physical Activity: Not on file  Stress: Not on file  Social Connections: Not on file     Family History: The patient's family history includes Cancer in his brother; Heart attack in his mother; Liver cancer in his brother; Stroke in his father and maternal grandfather. There is no history of Colon cancer, Rectal cancer, Stomach cancer, Colon polyps, or Esophageal  cancer.  ROS:   Please see the history of present illness.    All other systems reviewed and are negative.  EKGs/Labs/Other Studies Reviewed:    The following studies were reviewed today: EKG reveals sinus rhythm poor anterior forces and nonspecific ST-T changes   Recent Labs: 06/05/2021: ALT 16; BUN 5; Hemoglobin 15.4; Platelets 205; Potassium 3.4; Sodium 138; TSH 1.850 11/18/2021: Creatinine, Ser 0.90  Recent Lipid Panel    Component Value Date/Time   CHOL 105 06/05/2021 0900   TRIG 150 (H) 06/05/2021 0900   HDL 39 (L) 06/05/2021 0900   CHOLHDL 2.7 06/05/2021 0900   LDLCALC 40 06/05/2021 0900    Physical Exam:    VS:  BP 128/78   Pulse 63   Ht 5' 8"$  (1.727 m)   Wt 225 lb 3.2 oz (102.2 kg)   SpO2 97%   BMI 34.24 kg/m     Wt Readings from Last 3 Encounters:  03/25/22 225 lb 3.2 oz (102.2 kg)  06/05/21 213 lb 9.6 oz (96.9 kg)  01/01/21 210 lb (95.3 kg)     GEN: Patient is in no acute distress HEENT: Normal NECK: No JVD; No carotid bruits LYMPHATICS: No lymphadenopathy CARDIAC: Hear sounds regular, 2/6 systolic murmur at the apex. RESPIRATORY:  Clear to auscultation without rales, wheezing or rhonchi  ABDOMEN: Soft, non-tender, non-distended MUSCULOSKELETAL:  No edema; No deformity  SKIN: Warm and dry NEUROLOGIC:  Alert and oriented x 3 PSYCHIATRIC:  Normal affect   Signed, Jenean Lindau, MD  03/25/2022 3:50 PM    [facility]

## 2022-03-25 NOTE — Progress Notes (Signed)
EKG

## 2022-03-25 NOTE — Patient Instructions (Signed)
Medication Instructions:  Your physician recommends that you continue on your current medications as directed. Please refer to the Current Medication list given to you today.  *If you need a refill on your cardiac medications before your next appointment, please call your pharmacy*   Lab Work: Your physician recommends that you have a BMP done today in the office.  If you have labs (blood work) drawn today and your tests are completely normal, you will receive your results only by: [place] (if you have MyChart) OR A paper copy in the mail If you have any lab test that is abnormal or we need to change your treatment, we will call you to review the results.   Testing/Procedures: None ordered   Follow-Up: At [facility], you and your health needs are our priority.  As part of our continuing mission to provide you with exceptional heart care, we have created designated Provider Care Teams.  These Care Teams include your primary Cardiologist (physician) and Advanced Practice Providers (APPs -  Physician Assistants and Nurse Practitioners) who all work together to provide you with the care you need, when you need it.  We recommend signing up for the patient portal called "MyChart".  Sign up information is provided on this After Visit Summary.  MyChart is used to connect with patients for Virtual Visits (Telemedicine).  Patients are able to view lab/test results, encounter notes, upcoming appointments, etc.  Non-urgent messages can be sent to your provider as well.   To learn more about what you can do with MyChart, go to [email or web address].    Your next appointment:   9 month(s)  The format for your next appointment:   In Person  Provider:   Jyl Heinz, MD    Other Instructions none  Important Information About Sugar

## 2022-03-26 LAB — BASIC METABOLIC PANEL
BUN/Creatinine Ratio: 6 — ABNORMAL LOW (ref 10–24)
BUN: 6 mg/dL — ABNORMAL LOW (ref 8–27)
CO2: 25 mmol/L (ref 20–29)
Calcium: 9.7 mg/dL (ref 8.6–10.2)
Chloride: 99 mmol/L (ref 96–106)
Creatinine, Ser: 0.96 mg/dL (ref 0.76–1.27)
Glucose: 90 mg/dL (ref 70–99)
Potassium: 3.6 mmol/L (ref 3.5–5.2)
Sodium: 139 mmol/L (ref 134–144)
eGFR: 89 mL/min/{1.73_m2} (ref >59)

## 2022-06-10 DIAGNOSIS — I340 Nonrheumatic mitral (valve) insufficiency: Secondary | ICD-10-CM | POA: Diagnosis not present

## 2022-11-25 ENCOUNTER — Telehealth (HOSPITAL_COMMUNITY): Payer: Self-pay

## 2022-11-25 ENCOUNTER — Other Ambulatory Visit (HOSPITAL_COMMUNITY): Payer: Self-pay | Admitting: Interventional Radiology

## 2022-11-25 DIAGNOSIS — I771 Stricture of artery: Secondary | ICD-10-CM

## 2022-11-25 NOTE — Telephone Encounter (Signed)
Called to schedule cta head/neck, no answer, left vm. AB  

## 2022-12-05 ENCOUNTER — Ambulatory Visit (HOSPITAL_COMMUNITY)
Admission: RE | Admit: 2022-12-05 | Discharge: 2022-12-05 | Disposition: A | Discharge status: Home / Self Care | Payer: BC Managed Care – PPO | Source: Ambulatory Visit | Attending: Interventional Radiology | Admitting: Interventional Radiology

## 2022-12-05 DIAGNOSIS — I771 Stricture of artery: Secondary | ICD-10-CM

## 2022-12-05 LAB — POCT I-STAT CREATININE: Creatinine, Ser: 1.2 mg/dL (ref 0.61–1.24)

## 2022-12-05 MED ORDER — IOHEXOL 350 MG/ML SOLN
75.0000 mL | Freq: Once | INTRAVENOUS | Status: AC | PRN
  Administered 2022-12-05: 75 mL via INTRAVENOUS

## 2022-12-10 ENCOUNTER — Telehealth (HOSPITAL_COMMUNITY): Payer: Self-pay

## 2022-12-10 NOTE — Telephone Encounter (Signed)
Called pt regarding recent imaging, no answer, left vm. AB  

## 2022-12-11 ENCOUNTER — Telehealth (HOSPITAL_COMMUNITY): Payer: Self-pay

## 2022-12-11 NOTE — Telephone Encounter (Signed)
Pt agreed to f/u in 2 years with a cta head and neck. AB

## 2023-06-03 ENCOUNTER — Ambulatory Visit: Admitting: Family Medicine

## 2023-07-06 ENCOUNTER — Other Ambulatory Visit: Payer: Self-pay | Admitting: Orthopedic Surgery

## 2023-07-07 NOTE — Progress Notes (Addendum)
 Surgical Instructions   Your procedure is scheduled on Thursday June 5. Report to [facility] Main Entrance "A" at 9:00 A.M., then check in with the Admitting office. Any questions or running late day of surgery: call [phone]  Questions prior to your surgery date: call [phone], Monday-Friday, 8am-4pm. If you experience any cold or flu symptoms such as cough, fever, chills, shortness of breath, etc. between now and your scheduled surgery, please notify us  at the above number.     Remember:  Do not eat after midnight the night before your surgery  You may drink clear liquids until 9:00am the morning of your surgery.   Clear liquids allowed are: Water, Non-Citrus Juices (without pulp), Carbonated Beverages, Clear Tea (no milk, honey, etc.), Black Coffee Only (NO MILK, CREAM OR POWDERED CREAMER of any kind), and Gatorade. Patient Instructions  The night before surgery:  No food after midnight. ONLY clear liquids after midnight  The day of surgery (if you have diabetes): Drink ONE (1) 12 oz G2 given to you in your pre admission testing appointment by 9:00 am the morning of surgery. Drink in one sitting. Do not sip.  This drink was given to you during your hospital  pre-op appointment visit.  Nothing else to drink after completing the  12 oz bottle of G2.         If you have questions, please contact your surgeon's office.    Take these medicines the morning of surgery with A SIP OF WATER  amLODipine (NORVASC)  brimonidine (ALPHAGAN)  metoprolol succinate (TOPROL-XL)  pregabalin (LYRICA   May take these medicines IF NEEDED: albuterol (VENTOLIN HFA) -bring this inhaler to the hospital  cetirizine (ZYRTEC)  famotidine (PEPCID)  HYDROcodone -Acetaminophen   montelukast (SINGULAIR)  PULMICORT FLEXHALER  UBRELVY   One week prior to surgery, STOP taking any Aleve, Naproxen, Ibuprofen, Motrin, Advil, Goody's, BC's, all herbal medications, fish oil, and non-prescription  vitamins.  FOLLOW YOUR SURGEON'S INSTRUCTIONS FOR HOLDING ASPIRIN AND PLAVIX. IF NO INSTRUCTIONS WERE GIVEN YOU MUST CALL THE OFFICE IMMEDIATELY.     WHAT DO I DO ABOUT MY DIABETES MEDICATION?   Do not take oral diabetes medicines (pills) the morning of surgery. Do not take glimepiride (AMARYL) the morning of surgery.         Hold empagliflozin (JARDIANCE) for 72 hours prior to surgery. Do not take after June 1.  HOW TO MANAGE YOUR DIABETES BEFORE AND AFTER SURGERY  Why is it important to control my blood sugar before and after surgery? Improving blood sugar levels before and after surgery helps healing and can limit problems. A way of improving blood sugar control is eating a healthy diet by:  Eating less sugar and carbohydrates  Increasing activity/exercise  Talking with your doctor about reaching your blood sugar goals High blood sugars (greater than 180 mg/dL) can raise your risk of infections and slow your recovery, so you will need to focus on controlling your diabetes during the weeks before surgery. Make sure that the doctor who takes care of your diabetes knows about your planned surgery including the date and location.  How do I manage my blood sugar before surgery? Check your blood sugar at least 4 times a day, starting 2 days before surgery, to make sure that the level is not too high or low.  Check your blood sugar the morning of your surgery when you wake up and every 2 hours until you get to the Short Stay unit.  If your blood sugar is  less than 70 mg/dL, you will need to treat for low blood sugar: Do not take insulin . Treat a low blood sugar (less than 70 mg/dL) with  cup of clear juice (cranberry or apple), 4 glucose tablets, OR glucose gel. Recheck blood sugar in 15 minutes after treatment (to make sure it is greater than 70 mg/dL). If your blood sugar is not greater than 70 mg/dL on recheck, call [phone] for further instructions. Report your blood sugar to the  short stay nurse when you get to Short Stay.  If you are admitted to the hospital after surgery: Your blood sugar will be checked by the staff and you will probably be given insulin  after surgery (instead of oral diabetes medicines) to make sure you have good blood sugar levels. The goal for blood sugar control after surgery is 80-180 mg/dL.                   Do NOT Smoke (Tobacco/Vaping) for 24 hours prior to your procedure.  If you use a CPAP at night, you may bring your mask/headgear for your overnight stay.   You will be asked to remove any contacts, glasses, piercing's, hearing aid's, dentures/partials prior to surgery. Please bring cases for these items if needed.    Patients discharged the day of surgery will not be allowed to drive home, and someone needs to stay with them for 24 hours.  SURGICAL WAITING ROOM VISITATION Patients may have no more than 2 support people in the waiting area - these visitors may rotate.   Pre-op nurse will coordinate an appropriate time for 1 ADULT support person, who may not rotate, to accompany patient in pre-op.  Children under the age of 23 must have an adult with them who is not the patient and must remain in the main waiting area with an adult.  If the patient needs to stay at the hospital during part of their recovery, the visitor guidelines for inpatient rooms apply.  Please refer to the [facility] website for the visitor guidelines for any additional information.   If you received a COVID test during your pre-op visit  it is requested that you wear a mask when out in public, stay away from anyone that may not be feeling well and notify your surgeon if you develop symptoms. If you have been in contact with anyone that has tested positive in the last 10 days please notify you surgeon.      Pre-operative 5 CHG Bathing Instructions   You can play a key role in reducing the risk of infection after surgery. Your skin needs to be as free of germs  as possible. You can reduce the number of germs on your skin by washing with CHG (chlorhexidine  gluconate) soap before surgery. CHG is an antiseptic soap that kills germs and continues to kill germs even after washing.   DO NOT use if you have an allergy to chlorhexidine /CHG or antibacterial soaps. If your skin becomes reddened or irritated, stop using the CHG and notify one of our RNs at [phone].   Please shower with the CHG soap starting 4 days before surgery using the following schedule:     Please keep in mind the following:  DO NOT shave, including legs and underarms, starting the day of your first shower.   You may shave your face at any point before/day of surgery.  Place clean sheets on your bed the day you start using CHG soap. Use a clean washcloth (not  used since being washed) for each shower. DO NOT sleep with pets once you start using the CHG.   CHG Shower Instructions:  Wash your face and private area with normal soap. If you choose to wash your hair, wash first with your normal shampoo.  After you use shampoo/soap, rinse your hair and body thoroughly to remove shampoo/soap residue.  Turn the water OFF and apply about 3 tablespoons (45 ml) of CHG soap to a CLEAN washcloth.  Apply CHG soap ONLY FROM YOUR NECK DOWN TO YOUR TOES (washing for 3-5 minutes)  DO NOT use CHG soap on face, private areas, open wounds, or sores.  Pay special attention to the area where your surgery is being performed.  If you are having back surgery, having someone wash your back for you may be helpful. Wait 2 minutes after CHG soap is applied, then you may rinse off the CHG soap.  Pat dry with a clean towel  Put on clean clothes/pajamas   If you choose to wear lotion, please use ONLY the CHG-compatible lotions that are listed below.  Additional instructions for the day of surgery: DO NOT APPLY any lotions, deodorants, cologne, or perfumes.   Do not bring valuables to the hospital. [facility]  is not responsible for any belongings/valuables. Do not wear nail polish, gel polish, artificial nails, or any other type of covering on natural nails (fingers and toes) Do not wear jewelry or makeup Put on clean/comfortable clothes.  Please brush your teeth.  Ask your nurse before applying any prescription medications to the skin.     CHG Compatible Lotions   Aveeno Moisturizing lotion  Cetaphil Moisturizing Cream  Cetaphil Moisturizing Lotion  Clairol Herbal Essence Moisturizing Lotion, Dry Skin  Clairol Herbal Essence Moisturizing Lotion, Extra Dry Skin  Clairol Herbal Essence Moisturizing Lotion, Normal Skin  Curel Age Defying Therapeutic Moisturizing Lotion with Alpha Hydroxy  Curel Extreme Care Body Lotion  Curel Soothing Hands Moisturizing Hand Lotion  Curel Therapeutic Moisturizing Cream, Fragrance-Free  Curel Therapeutic Moisturizing Lotion, Fragrance-Free  Curel Therapeutic Moisturizing Lotion, Original Formula  Eucerin Daily Replenishing Lotion  Eucerin Dry Skin Therapy Plus Alpha Hydroxy Crme  Eucerin Dry Skin Therapy Plus Alpha Hydroxy Lotion  Eucerin Original Crme  Eucerin Original Lotion  Eucerin Plus Crme Eucerin Plus Lotion  Eucerin TriLipid Replenishing Lotion  Keri Anti-Bacterial Hand Lotion  Keri Deep Conditioning Original Lotion Dry Skin Formula Softly Scented  Keri Deep Conditioning Original Lotion, Fragrance Free Sensitive Skin Formula  Keri Lotion Fast Absorbing Fragrance Free Sensitive Skin Formula  Keri Lotion Fast Absorbing Softly Scented Dry Skin Formula  Keri Original Lotion  Keri Skin Renewal Lotion Keri Silky Smooth Lotion  Keri Silky Smooth Sensitive Skin Lotion  Nivea Body Creamy Conditioning Oil  Nivea Body Extra Enriched Lotion  Nivea Body Original Lotion  Nivea Body Sheer Moisturizing Lotion Nivea Crme  Nivea Skin Firming Lotion  NutraDerm 30 Skin Lotion  NutraDerm Skin Lotion  NutraDerm Therapeutic Skin Cream  NutraDerm  Therapeutic Skin Lotion  ProShield Protective Hand Cream  Provon moisturizing lotion  Please read over the following fact sheets that you were given.

## 2023-07-08 ENCOUNTER — Other Ambulatory Visit: Payer: Self-pay

## 2023-07-08 ENCOUNTER — Encounter (HOSPITAL_COMMUNITY): Payer: Self-pay

## 2023-07-08 ENCOUNTER — Encounter (HOSPITAL_COMMUNITY)
Admission: RE | Admit: 2023-07-08 | Discharge: 2023-07-08 | Disposition: A | Discharge status: Home / Self Care | Source: Ambulatory Visit | Attending: Orthopedic Surgery | Admitting: Orthopedic Surgery

## 2023-07-08 VITALS — BP 138/79 | HR 64 | Temp 98.0°F | Resp 18 | Ht 68.0 in | Wt 200.4 lb

## 2023-07-08 DIAGNOSIS — R531 Weakness: Secondary | ICD-10-CM | POA: Diagnosis not present

## 2023-07-08 DIAGNOSIS — F17290 Nicotine dependence, other tobacco product, uncomplicated: Secondary | ICD-10-CM | POA: Diagnosis not present

## 2023-07-08 DIAGNOSIS — I491 Atrial premature depolarization: Secondary | ICD-10-CM | POA: Insufficient documentation

## 2023-07-08 DIAGNOSIS — Z981 Arthrodesis status: Secondary | ICD-10-CM | POA: Diagnosis not present

## 2023-07-08 DIAGNOSIS — T85848A Pain due to other internal prosthetic devices, implants and grafts, initial encounter: Secondary | ICD-10-CM | POA: Diagnosis present

## 2023-07-08 DIAGNOSIS — E1151 Type 2 diabetes mellitus with diabetic peripheral angiopathy without gangrene: Secondary | ICD-10-CM | POA: Diagnosis not present

## 2023-07-08 DIAGNOSIS — Z8673 Personal history of transient ischemic attack (TIA), and cerebral infarction without residual deficits: Secondary | ICD-10-CM | POA: Diagnosis not present

## 2023-07-08 DIAGNOSIS — F17210 Nicotine dependence, cigarettes, uncomplicated: Secondary | ICD-10-CM | POA: Diagnosis not present

## 2023-07-08 DIAGNOSIS — Y831 Surgical operation with implant of artificial internal device as the cause of abnormal reaction of the patient, or of later complication, without mention of misadventure at the time of the procedure: Secondary | ICD-10-CM | POA: Diagnosis not present

## 2023-07-08 DIAGNOSIS — Z01818 Encounter for other preprocedural examination: Secondary | ICD-10-CM | POA: Insufficient documentation

## 2023-07-08 DIAGNOSIS — I252 Old myocardial infarction: Secondary | ICD-10-CM | POA: Diagnosis not present

## 2023-07-08 DIAGNOSIS — I2510 Atherosclerotic heart disease of native coronary artery without angina pectoris: Secondary | ICD-10-CM | POA: Diagnosis not present

## 2023-07-08 DIAGNOSIS — Z7984 Long term (current) use of oral hypoglycemic drugs: Secondary | ICD-10-CM | POA: Diagnosis not present

## 2023-07-08 DIAGNOSIS — I10 Essential (primary) hypertension: Secondary | ICD-10-CM | POA: Diagnosis not present

## 2023-07-08 LAB — SURGICAL PCR SCREEN
MRSA, PCR: NEGATIVE
Staphylococcus aureus: NEGATIVE

## 2023-07-08 LAB — GLUCOSE, CAPILLARY: Glucose-Capillary: 125 mg/dL — ABNORMAL HIGH (ref 70–99)

## 2023-07-08 LAB — CBC
HCT: 47.3 % (ref 39.0–52.0)
Hemoglobin: 15.2 g/dL (ref 13.0–17.0)
MCH: 30.2 pg (ref 26.0–34.0)
MCHC: 32.1 g/dL (ref 30.0–36.0)
MCV: 93.8 fL (ref 80.0–100.0)
Platelets: 169 10*3/uL (ref 150–400)
RBC: 5.04 MIL/uL (ref 4.22–5.81)
RDW: 12.7 % (ref 11.5–15.5)
WBC: 4.8 10*3/uL (ref 4.0–10.5)
nRBC: 0 % (ref 0.0–0.2)

## 2023-07-08 LAB — BASIC METABOLIC PANEL WITH GFR
Anion gap: 8 (ref 5–15)
BUN: 6 mg/dL — ABNORMAL LOW (ref 8–23)
CO2: 33 mmol/L — ABNORMAL HIGH (ref 22–32)
Calcium: 9.5 mg/dL (ref 8.9–10.3)
Chloride: 101 mmol/L (ref 98–111)
Creatinine, Ser: 1.01 mg/dL (ref 0.61–1.24)
GFR, Estimated: >60 mL/min (ref >60)
Glucose, Bld: 79 mg/dL (ref 70–99)
Potassium: 3.9 mmol/L (ref 3.5–5.1)
Sodium: 142 mmol/L (ref 135–145)

## 2023-07-08 LAB — HEMOGLOBIN A1C
Hgb A1c MFr Bld: 5.7 % — ABNORMAL HIGH (ref 4.8–5.6)
Mean Plasma Glucose: 116.89 mg/dL

## 2023-07-08 LAB — TYPE AND SCREEN
ABO/RH(D): O POS
Antibody Screen: NEGATIVE

## 2023-07-08 NOTE — Progress Notes (Signed)
 PCP - Rowena Sistasis,MD Cardiologist - none  PPM/ICD - Denies  Device Orders -  Rep Notified -   Chest x-ray -  EKG - 07/08/23 Stress Test - 11/22/19 ECHO - TTE-06/10/22 Cardiac Cath -denies   Sleep Study - denies CPAP -   Fasting Blood Sugar - 90's Checks Blood Sugar once a week  Last dose of GLP1 agonist-  na  GLP1 instructions:   Blood Thinner Instructions: Aspirin Instructions:  ERAS Protcol - clear liquids until 0900 PRE-SURGERY Ensure or G2- G2  COVID TEST- na   Anesthesia review: yes-hx -CAD,CVA,HTN, SVT,tobacco abuse.  Patient denies shortness of breath, fever, cough and chest pain at PAT appointment   All instructions explained to the patient, with a verbal understanding of the material. Patient agrees to go over the instructions while at home for a better understanding.  The opportunity to ask questions was provided.

## 2023-07-08 NOTE — Anesthesia Preprocedure Evaluation (Signed)
 Anesthesia Evaluation  Patient identified by MRN, date of birth, ID band Patient awake    Reviewed: Allergy & Precautions, H&P , NPO status , Patient's Chart, lab work & pertinent test results  Airway Mallampati: III  TM Distance: >3 FB Neck ROM: Full    Dental  (+) Edentulous Upper, Missing, Dental Advisory Given   Pulmonary asthma (inhaler use once a year) , Current Smoker (1/2ppd, vapes) and Patient abstained from smoking. Neg sleep study a few years ago per pt Has not been taking pulmicort daily   Pulmonary exam normal breath sounds clear to auscultation       Cardiovascular hypertension (146/88 preop), Pt. on medications + CAD, + Past MI and + Peripheral Vascular Disease  Normal cardiovascular exam+ dysrhythmias Supra Ventricular Tachycardia + Valvular Problems/Murmurs (mild MR) MR  Rhythm:Regular Rate:Normal  Stress test was done 11/22/2019 and was low risk, EF 68%, no ischemia.  More recently he had an echocardiogram at [facility] 06/10/2022 showing EF 55 to 60%, normal RV function mild mitral regurgitation.    Neuro/Psych subsequent right vertebral artery stenting by Dr. Alvira Josephs in 2012 CVA (2011, plavix LD 1 week), No Residual Symptoms  negative psych ROS   GI/Hepatic Neg liver ROS,GERD  Controlled,,  Endo/Other  diabetes, Well Controlled, Type 2, Oral Hypoglycemic Agents  BMI 31  Renal/GU negative Renal ROS  negative genitourinary   Musculoskeletal  (+) Arthritis , Osteoarthritis,    Abdominal   Peds negative pediatric ROS (+)  Hematology negative hematology ROS (+) Hb 15.2, plt 169   Anesthesia Other Findings   Reproductive/Obstetrics negative OB ROS                             Anesthesia Physical Anesthesia Plan  ASA: 3  Anesthesia Plan: General   Post-op Pain Management: Tylenol  PO (pre-op)* and Ketamine IV*   Induction: Intravenous  PONV Risk Score and Plan: 1  and Ondansetron , Dexamethasone , Treatment may vary due to age or medical condition and Midazolam   Airway Management Planned: Oral ETT  Additional Equipment: None  Intra-op Plan:   Post-operative Plan: Extubation in OR  Informed Consent: I have reviewed the patients History and Physical, chart, labs and discussed the procedure including the risks, benefits and alternatives for the proposed anesthesia with the patient or authorized representative who has indicated his/her understanding and acceptance.     Dental advisory given  Plan Discussed with: CRNA  Anesthesia Plan Comments: ( )        Anesthesia Quick Evaluation

## 2023-07-08 NOTE — Progress Notes (Signed)
 Anesthesia Chart Review:   History of CVA 2010 and 2011 with subsequent right vertebral artery stenting by Dr. Alvira Josephs in 2012.  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 He is maintained on Plavix for this.  Follows with neurology for history of headaches, maintained on Ubrelvy.  He was also cleared by neurology to hold Plavix for upcoming surgery.  Patient previously seen by cardiologist Dr. Lafayette Pierre on 11/18/2019 for preop evaluation prior to undergoing back surgery.  Due to risk factors of HLD, HTN, DM2, Dr. Lafayette Pierre ordered stress test for preoperative stratification.  Stress test was done 11/22/2019 and was low risk, EF 68%, no ischemia.  More recently he had an echocardiogram at [facility] 06/10/2022 showing EF 55 to 60%, normal RV function mild mitral regurgitation.  Other pertinent history includes current everyday smoker, asthma, GERD on H2 blocker, non-insulin -dependent DM2, prior C3-7 ACDF.  Preop labs reviewed, unremarkable.  DM2 well-controlled with A1c 5.7.   EKG 07/08/23: Sinus rhythm with Premature atrial complexes. Rate 60. Left axis deviation. Anteroseptal infarct , age undetermined   CTA head neck 12/05/2022: IMPRESSION: 1. No acute intracranial process. Remote infarcts in the left occipital lobe and bilateral cerebellar hemispheres. 2. No new intracranial large vessel occlusion or significant stenosis. 3. Redemonstrated hypoplastic left vertebral artery, with diminishing enhancement in the V2 and V3 segments, and unchanged occlusion of the proximal left V4. Distal reconstitution of the left V4, likely retrograde. 4. No other hemodynamically significant stenosis in the neck.  TTE 06/10/2022: Conclusions: Left ventricular chamber size is normal. Left ventricular ejection fraction is 55 to 60%. There is mild mitral regurgitation. The aortic valve is trileaflet and mildly sclerotic without evidence of  stenosis. There is trace pulmonic valve regurgitation. Pulmonic artery systolic pressures are 28 mmHg. A saline bubble study was performed. No obvious patent foreman ovale seen.  Nuclear stress 11/22/2019: The left ventricular ejection fraction is hyperdynamic (>65%). Nuclear stress EF: 68%. There was no ST segment deviation noted during stress. No T wave inversion was noted during stress. The study is normal. This is a low risk study.     Cerebral angiogram 08/19/2019: S/P bilateral common carotid ,RT vert artery and Lt subclavian arteriograms. RT CFA approach. Findings. 1. 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 S.Deveshwar MD     Rudy Costain, PA-C [facility] Short Stay Center/Anesthesiology Phone [phone] 07/08/2023 4:07 PM

## 2023-07-09 ENCOUNTER — Other Ambulatory Visit: Payer: Self-pay

## 2023-07-09 ENCOUNTER — Ambulatory Visit (HOSPITAL_COMMUNITY)

## 2023-07-09 ENCOUNTER — Ambulatory Visit (HOSPITAL_COMMUNITY): Payer: Self-pay | Admitting: Physician Assistant

## 2023-07-09 ENCOUNTER — Ambulatory Visit (HOSPITAL_COMMUNITY)
Admission: RE | Disposition: A | Discharge status: Home / Self Care | Payer: Self-pay | Source: Home / Self Care | Attending: Orthopedic Surgery

## 2023-07-09 ENCOUNTER — Encounter (HOSPITAL_COMMUNITY): Payer: Self-pay | Admitting: Orthopedic Surgery

## 2023-07-09 ENCOUNTER — Ambulatory Visit (HOSPITAL_COMMUNITY)
Admission: RE | Admit: 2023-07-09 | Discharge: 2023-07-09 | Disposition: A | Discharge status: Home / Self Care | Attending: Orthopedic Surgery | Admitting: Orthopedic Surgery

## 2023-07-09 DIAGNOSIS — I252 Old myocardial infarction: Secondary | ICD-10-CM | POA: Insufficient documentation

## 2023-07-09 DIAGNOSIS — Z7984 Long term (current) use of oral hypoglycemic drugs: Secondary | ICD-10-CM | POA: Insufficient documentation

## 2023-07-09 DIAGNOSIS — Z981 Arthrodesis status: Secondary | ICD-10-CM | POA: Insufficient documentation

## 2023-07-09 DIAGNOSIS — Z8673 Personal history of transient ischemic attack (TIA), and cerebral infarction without residual deficits: Secondary | ICD-10-CM | POA: Insufficient documentation

## 2023-07-09 DIAGNOSIS — I10 Essential (primary) hypertension: Secondary | ICD-10-CM | POA: Insufficient documentation

## 2023-07-09 DIAGNOSIS — F17210 Nicotine dependence, cigarettes, uncomplicated: Secondary | ICD-10-CM | POA: Insufficient documentation

## 2023-07-09 DIAGNOSIS — E1151 Type 2 diabetes mellitus with diabetic peripheral angiopathy without gangrene: Secondary | ICD-10-CM | POA: Insufficient documentation

## 2023-07-09 DIAGNOSIS — Z01818 Encounter for other preprocedural examination: Secondary | ICD-10-CM

## 2023-07-09 DIAGNOSIS — Y831 Surgical operation with implant of artificial internal device as the cause of abnormal reaction of the patient, or of later complication, without mention of misadventure at the time of the procedure: Secondary | ICD-10-CM | POA: Insufficient documentation

## 2023-07-09 DIAGNOSIS — T85848A Pain due to other internal prosthetic devices, implants and grafts, initial encounter: Secondary | ICD-10-CM | POA: Diagnosis not present

## 2023-07-09 DIAGNOSIS — R531 Weakness: Secondary | ICD-10-CM | POA: Insufficient documentation

## 2023-07-09 DIAGNOSIS — I2510 Atherosclerotic heart disease of native coronary artery without angina pectoris: Secondary | ICD-10-CM | POA: Insufficient documentation

## 2023-07-09 DIAGNOSIS — F17290 Nicotine dependence, other tobacco product, uncomplicated: Secondary | ICD-10-CM | POA: Insufficient documentation

## 2023-07-09 LAB — GLUCOSE, CAPILLARY
Glucose-Capillary: 121 mg/dL — ABNORMAL HIGH (ref 70–99)
Glucose-Capillary: 108 mg/dL — ABNORMAL HIGH (ref 70–99)
Glucose-Capillary: 127 mg/dL — ABNORMAL HIGH (ref 70–99)

## 2023-07-09 SURGERY — POSTERIOR LUMBAR FUSION 1 WITH HARDWARE REMOVAL
Anesthesia: General | Site: Spine Lumbar

## 2023-07-09 MED ORDER — THROMBIN 20000 UNITS EX SOLR
CUTANEOUS | Status: AC
  Filled 2023-07-09: qty 20000

## 2023-07-09 MED ORDER — DEXAMETHASONE SODIUM PHOSPHATE 10 MG/ML IJ SOLN
INTRAMUSCULAR | Status: AC
  Filled 2023-07-09: qty 1

## 2023-07-09 MED ORDER — OXYCODONE HCL 5 MG PO TABS
5.0000 mg | ORAL_TABLET | Freq: Once | ORAL | Status: AC | PRN
  Administered 2023-07-09: 5 mg via ORAL

## 2023-07-09 MED ORDER — BUPIVACAINE-EPINEPHRINE (PF) 0.25% -1:200000 IJ SOLN
INTRAMUSCULAR | Status: DC | PRN
  Administered 2023-07-09: 40 mL

## 2023-07-09 MED ORDER — MIDAZOLAM HCL 2 MG/2ML IJ SOLN
INTRAMUSCULAR | Status: DC | PRN
Start: 2023-07-09 — End: 2023-07-09
  Administered 2023-07-09: 2 mg via INTRAVENOUS

## 2023-07-09 MED ORDER — KETAMINE HCL 50 MG/5ML IJ SOSY
PREFILLED_SYRINGE | INTRAMUSCULAR | Status: AC
  Filled 2023-07-09: qty 5

## 2023-07-09 MED ORDER — KETAMINE HCL 10 MG/ML IJ SOLN
INTRAMUSCULAR | Status: DC | PRN
  Administered 2023-07-09: 30 mg via INTRAVENOUS
  Administered 2023-07-09: 20 mg via INTRAVENOUS

## 2023-07-09 MED ORDER — ONDANSETRON HCL 4 MG/2ML IJ SOLN
4.0000 mg | Freq: Once | INTRAMUSCULAR | Status: DC | PRN
Start: 2023-07-09 — End: 2023-07-09

## 2023-07-09 MED ORDER — MIDAZOLAM HCL 2 MG/2ML IJ SOLN
INTRAMUSCULAR | Status: AC
  Filled 2023-07-09: qty 2

## 2023-07-09 MED ORDER — HYDROMORPHONE HCL 1 MG/ML IJ SOLN
INTRAMUSCULAR | Status: DC | PRN
  Administered 2023-07-09: 1 mg via INTRAVENOUS

## 2023-07-09 MED ORDER — PHENYLEPHRINE 80 MCG/ML (10ML) SYRINGE FOR IV PUSH (FOR BLOOD PRESSURE SUPPORT)
PREFILLED_SYRINGE | INTRAVENOUS | Status: AC
Start: 2023-07-09 — End: ?
  Filled 2023-07-09: qty 10

## 2023-07-09 MED ORDER — HYDROMORPHONE HCL 1 MG/ML IJ SOLN: 0.2500 mg | INTRAMUSCULAR | Status: DC | PRN

## 2023-07-09 MED ORDER — BUPIVACAINE LIPOSOME 1.3 % IJ SUSP
INTRAMUSCULAR | Status: AC
  Filled 2023-07-09: qty 20

## 2023-07-09 MED ORDER — CEFAZOLIN SODIUM-DEXTROSE 2-4 GM/100ML-% IV SOLN
2.0000 g | INTRAVENOUS | Status: AC
  Administered 2023-07-09: 2 g via INTRAVENOUS
  Filled 2023-07-09: qty 100

## 2023-07-09 MED ORDER — LIDOCAINE 2% (20 MG/ML) 5 ML SYRINGE
INTRAMUSCULAR | Status: DC | PRN
  Administered 2023-07-09: 60 mg via INTRAVENOUS

## 2023-07-09 MED ORDER — METHOCARBAMOL 500 MG PO TABS: 500.0000 mg | ORAL_TABLET | Freq: Four times a day (QID) | ORAL | 0 refills | Status: AC

## 2023-07-09 MED ORDER — INSULIN ASPART 100 UNIT/ML IJ SOLN: 0.0000 [IU] | INTRAMUSCULAR | Status: DC | PRN

## 2023-07-09 MED ORDER — DEXAMETHASONE SODIUM PHOSPHATE 10 MG/ML IJ SOLN
INTRAMUSCULAR | Status: DC | PRN
  Administered 2023-07-09: 5 mg via INTRAVENOUS

## 2023-07-09 MED ORDER — SODIUM CHLORIDE 0.9 % IV SOLN
INTRAVENOUS | Status: DC | PRN
Start: 2023-07-09 — End: 2023-07-09

## 2023-07-09 MED ORDER — EPHEDRINE SULFATE-NACL 50-0.9 MG/10ML-% IV SOSY
PREFILLED_SYRINGE | INTRAVENOUS | Status: DC | PRN
  Administered 2023-07-09: 5 mg via INTRAVENOUS

## 2023-07-09 MED ORDER — THROMBIN 20000 UNITS EX KIT
PACK | CUTANEOUS | Status: DC | PRN
  Administered 2023-07-09: 20 mL via TOPICAL

## 2023-07-09 MED ORDER — LACTATED RINGERS IV SOLN: INTRAVENOUS | Status: DC

## 2023-07-09 MED ORDER — ROCURONIUM BROMIDE 10 MG/ML (PF) SYRINGE
PREFILLED_SYRINGE | INTRAVENOUS | Status: DC | PRN
  Administered 2023-07-09: 80 mg via INTRAVENOUS

## 2023-07-09 MED ORDER — ONDANSETRON HCL 4 MG/2ML IJ SOLN
INTRAMUSCULAR | Status: DC | PRN
  Administered 2023-07-09: 4 mg via INTRAVENOUS

## 2023-07-09 MED ORDER — POVIDONE-IODINE 7.5 % EX SOLN: Freq: Once | CUTANEOUS | Status: DC

## 2023-07-09 MED ORDER — PROPOFOL 10 MG/ML IV BOLUS
INTRAVENOUS | Status: AC
  Filled 2023-07-09: qty 20

## 2023-07-09 MED ORDER — ACETAMINOPHEN 500 MG PO TABS
1000.0000 mg | ORAL_TABLET | Freq: Once | ORAL | Status: AC
  Administered 2023-07-09: 1000 mg via ORAL
  Filled 2023-07-09: qty 2

## 2023-07-09 MED ORDER — BUPIVACAINE-EPINEPHRINE (PF) 0.25% -1:200000 IJ SOLN
INTRAMUSCULAR | Status: AC
  Filled 2023-07-09: qty 30

## 2023-07-09 MED ORDER — AMISULPRIDE (ANTIEMETIC) 5 MG/2ML IV SOLN: 10.0000 mg | Freq: Once | INTRAVENOUS | Status: DC | PRN

## 2023-07-09 MED ORDER — HYDROMORPHONE HCL 1 MG/ML IJ SOLN
INTRAMUSCULAR | Status: AC
  Filled 2023-07-09: qty 1

## 2023-07-09 MED ORDER — EPHEDRINE 5 MG/ML INJ
INTRAVENOUS | Status: AC
  Filled 2023-07-09: qty 5

## 2023-07-09 MED ORDER — SODIUM CHLORIDE 0.9 % IV SOLN: INTRAVENOUS | Status: DC | PRN

## 2023-07-09 MED ORDER — OXYCODONE-ACETAMINOPHEN 5-325 MG PO TABS: 1.0000 | ORAL_TABLET | Freq: Four times a day (QID) | ORAL | 0 refills | Status: AC | PRN

## 2023-07-09 MED ORDER — BUPIVACAINE-EPINEPHRINE 0.25% -1:200000 IJ SOLN
INTRAMUSCULAR | Status: DC | PRN
  Administered 2023-07-09: 6 mL

## 2023-07-09 MED ORDER — ROCURONIUM BROMIDE 10 MG/ML (PF) SYRINGE
PREFILLED_SYRINGE | INTRAVENOUS | Status: AC
  Filled 2023-07-09: qty 10

## 2023-07-09 MED ORDER — OXYCODONE HCL 5 MG/5ML PO SOLN: 5.0000 mg | Freq: Once | ORAL | Status: AC | PRN

## 2023-07-09 MED ORDER — SUGAMMADEX SODIUM 200 MG/2ML IV SOLN
INTRAVENOUS | Status: DC | PRN
  Administered 2023-07-09 (×2): 200 mg via INTRAVENOUS

## 2023-07-09 MED ORDER — FENTANYL CITRATE (PF) 250 MCG/5ML IJ SOLN
INTRAMUSCULAR | Status: DC | PRN
  Administered 2023-07-09 (×2): 50 ug via INTRAVENOUS

## 2023-07-09 MED ORDER — CHLORHEXIDINE GLUCONATE 0.12 % MT SOLN
15.0000 mL | Freq: Once | OROMUCOSAL | Status: AC
  Administered 2023-07-09: 15 mL via OROMUCOSAL
  Filled 2023-07-09: qty 15

## 2023-07-09 MED ORDER — PROPOFOL 10 MG/ML IV BOLUS
INTRAVENOUS | Status: DC | PRN
Start: 2023-07-09 — End: 2023-07-09
  Administered 2023-07-09: 200 mg via INTRAVENOUS

## 2023-07-09 MED ORDER — ORAL CARE MOUTH RINSE: 15.0000 mL | Freq: Once | OROMUCOSAL | Status: AC

## 2023-07-09 MED ORDER — LIDOCAINE 2% (20 MG/ML) 5 ML SYRINGE
INTRAMUSCULAR | Status: AC
Start: 2023-07-09 — End: ?
  Filled 2023-07-09: qty 5

## 2023-07-09 MED ORDER — FENTANYL CITRATE (PF) 100 MCG/2ML IJ SOLN
INTRAMUSCULAR | Status: AC
Start: 2023-07-09 — End: 2023-07-09
  Filled 2023-07-09: qty 4

## 2023-07-09 MED ORDER — OXYCODONE HCL 5 MG PO TABS
ORAL_TABLET | ORAL | Status: AC
  Filled 2023-07-09: qty 1

## 2023-07-09 MED ORDER — ONDANSETRON HCL 4 MG/2ML IJ SOLN
INTRAMUSCULAR | Status: AC
Start: 2023-07-09 — End: ?
  Filled 2023-07-09: qty 2

## 2023-07-09 MED ORDER — 0.9 % SODIUM CHLORIDE (POUR BTL) OPTIME
TOPICAL | Status: DC | PRN
  Administered 2023-07-09: 1000 mL

## 2023-07-09 SURGICAL SUPPLY — 62 items
BAG COUNTER SPONGE SURGICOUNT (BAG) ×1 IMPLANT
BENZOIN TINCTURE PRP APPL 2/3 (GAUZE/BANDAGES/DRESSINGS) IMPLANT
BLADE CLIPPER SURG (BLADE) IMPLANT
BUR PRESCISION 1.7 ELITE (BURR) ×1 IMPLANT
BUR ROUND FLUTED 5 RND (BURR) IMPLANT
BUR ROUND PRECISION 4.0 (BURR) IMPLANT
BUR SABER RD CUTTING 3.0 (BURR) IMPLANT
CNTNR URN SCR LID CUP LEK RST (MISCELLANEOUS) ×1 IMPLANT
CORD BIPOLAR FORCEPS 12FT (ELECTRODE) ×1 IMPLANT
COVER SURGICAL LIGHT HANDLE (MISCELLANEOUS) ×1 IMPLANT
DRAPE C-ARM 42X72 X-RAY (DRAPES) ×1 IMPLANT
DRAPE POUCH INSTRU U-SHP 10X18 (DRAPES) ×1 IMPLANT
DRAPE SURG 17X23 STRL (DRAPES) ×3 IMPLANT
DRAPE SURG ORHT 6 SPLT 77X108 (DRAPES) ×1 IMPLANT
DURAPREP 26ML APPLICATOR (WOUND CARE) ×1 IMPLANT
ELECT CAUTERY BLADE 6.4 (BLADE) ×1 IMPLANT
ELECTRODE BLDE 4.0 EZ CLN MEGD (MISCELLANEOUS) ×1 IMPLANT
ELECTRODE REM PT RTRN 9FT ADLT (ELECTROSURGICAL) ×1 IMPLANT
EVACUATOR SILICONE 100CC (DRAIN) IMPLANT
GAUZE 4X4 16PLY ~~LOC~~+RFID DBL (SPONGE) ×4 IMPLANT
GAUZE SPONGE 4X4 12PLY STRL (GAUZE/BANDAGES/DRESSINGS) ×1 IMPLANT
GLOVE BIO SURGEON STRL SZ8 (GLOVE) ×1 IMPLANT
GLOVE BIOGEL PI IND STRL 7.0 (GLOVE) ×1 IMPLANT
GLOVE BIOGEL PI IND STRL 8 (GLOVE) ×1 IMPLANT
GOWN STRL REUS W/ TWL LRG LVL3 (GOWN DISPOSABLE) ×2 IMPLANT
GOWN STRL REUS W/ TWL XL LVL3 (GOWN DISPOSABLE) ×1 IMPLANT
IV CATH 14GX2 1/4 (CATHETERS) ×1 IMPLANT
KIT BASIN OR (CUSTOM PROCEDURE TRAY) ×1 IMPLANT
KIT POSITIONER JACKSON TABLE (MISCELLANEOUS) ×1 IMPLANT
KIT TURNOVER KIT B (KITS) ×1 IMPLANT
MARKER SKIN DUAL TIP RULER LAB (MISCELLANEOUS) ×1 IMPLANT
NDL HYPO 22X1.5 SAFETY MO (MISCELLANEOUS) IMPLANT
NDL HYPO 25GX1X1/2 BEV (NEEDLE) ×1 IMPLANT
NDL SPNL 18GX3.5 QUINCKE PK (NEEDLE) ×2 IMPLANT
NEEDLE HYPO 22X1.5 SAFETY MO (MISCELLANEOUS) ×1 IMPLANT
NEEDLE HYPO 25GX1X1/2 BEV (NEEDLE) ×1 IMPLANT
NEEDLE SPNL 18GX3.5 QUINCKE PK (NEEDLE) ×2 IMPLANT
NS IRRIG 1000ML POUR BTL (IV SOLUTION) ×1 IMPLANT
PACK LAMINECTOMY ORTHO (CUSTOM PROCEDURE TRAY) ×1 IMPLANT
PACK UNIVERSAL I (CUSTOM PROCEDURE TRAY) ×1 IMPLANT
PAD ARMBOARD POSITIONER FOAM (MISCELLANEOUS) ×2 IMPLANT
PATTIES SURGICAL .5 X1 (DISPOSABLE) ×1 IMPLANT
PATTIES SURGICAL .5X1.5 (GAUZE/BANDAGES/DRESSINGS) ×1 IMPLANT
SPONGE INTESTINAL PEANUT (DISPOSABLE) ×1 IMPLANT
SPONGE SURGIFOAM ABS GEL 100 (HEMOSTASIS) IMPLANT
STRIP CLOSURE SKIN 1/2X4 (GAUZE/BANDAGES/DRESSINGS) IMPLANT
SURGIFLO W/THROMBIN 8M KIT (HEMOSTASIS) IMPLANT
SUT MNCRL AB 4-0 PS2 18 (SUTURE) ×2 IMPLANT
SUT VIC AB 0 CT1 18XCR BRD 8 (SUTURE) ×1 IMPLANT
SUT VIC AB 1 CT1 18XCR BRD 8 (SUTURE) ×2 IMPLANT
SUT VIC AB 2-0 CT2 18 VCP726D (SUTURE) ×1 IMPLANT
SYR 20ML LL LF (SYRINGE) ×1 IMPLANT
SYR 30ML SLIP (SYRINGE) IMPLANT
SYR BULB IRRIG 60ML STRL (SYRINGE) ×1 IMPLANT
SYR CONTROL 10ML LL (SYRINGE) ×1 IMPLANT
SYR TB 1ML LUER SLIP (SYRINGE) ×1 IMPLANT
TAPE CLOTH SURG 4X10 WHT LF (GAUZE/BANDAGES/DRESSINGS) IMPLANT
TOWEL GREEN STERILE (TOWEL DISPOSABLE) ×1 IMPLANT
TOWEL GREEN STERILE FF (TOWEL DISPOSABLE) ×1 IMPLANT
TRAY FOLEY MTR SLVR 16FR STAT (SET/KITS/TRAYS/PACK) ×1 IMPLANT
WATER STERILE IRR 1000ML POUR (IV SOLUTION) ×1 IMPLANT
YANKAUER SUCT BULB TIP NO VENT (SUCTIONS) ×1 IMPLANT

## 2023-07-09 NOTE — Transfer of Care (Signed)
 Immediate Anesthesia Transfer of Care Note  Patient: Kyle Gray  Procedure(s) Performed: REMOVAL OF LUMBAR FIVE - SACRUM ONE INSTRUMENTATION (Spine Lumbar)  Patient Location: PACU  Anesthesia Type:General  Level of Consciousness: awake, alert , oriented, and patient cooperative  Airway & Oxygen Therapy: Patient Spontanous Breathing and Patient connected to nasal cannula oxygen  Post-op Assessment: Report given to RN, Post -op Vital signs reviewed and stable, Patient moving all extremities X 4, and Patient able to stick tongue midline  Post vital signs: Reviewed and stable  Last Vitals:  Vitals Value Taken Time  BP 175/108 07/09/23 1257  Temp 36.3 C 07/09/23 1257  Pulse 82 07/09/23 1259  Resp 20 07/09/23 1259  SpO2 95 % 07/09/23 1259  Vitals shown include unfiled device data.  Last Pain:  Vitals:   07/09/23 0826  TempSrc:   PainSc: 0-No pain         Complications: No notable events documented.

## 2023-07-09 NOTE — H&P (Signed)
 PREOPERATIVE H&P  Chief Complaint: Right leg pain and weakness  HPI: Kyle Gray is a 63 y.o. male who presents with ongoing pain in the right leg.  Of note, the patient's pain and weakness is chronic.  The patient is status post a lumbar fusion many years ago by another provider.  Upon my evaluation with the patient, he was noted to have right sided hardware that did breach the pedicle on the right at S1, which I did feel was responsible for the patient's ongoing right leg pain and weakness.  We did therefore discuss proceeding with hardware removal.  He did understand that this would not guarantee resolution of his leg symptoms.   Patient has failed multiple forms of conservative care and continues to have pain (see office notes for additional details regarding the patient's full course of treatment)  Past Medical History:  Diagnosis Date   Acute ill-defined cerebrovascular disease 04/06/2013   Allergy    Arteriosclerotic vascular disease 08/25/2019   Arthritis    Asthma    Per patient "mild"   Atopic dermatitis 06/17/2018   Benign hypertensive heart disease 06/11/2018   Benign paroxysmal positional vertigo 06/17/2018   Bilateral carotid artery stenosis 07/16/2016   CAD (coronary artery disease)    Carpal tunnel syndrome 05/14/2015   Chest discomfort 06/17/2018   Coughing 06/11/2018   CVA (cerebral vascular accident) (HCC)    Diabetes mellitus due to underlying condition with unspecified complications (HCC) 06/17/2018   Diabetes mellitus, new onset (HCC) 06/11/2018   Essential hypertension 06/17/2018   GERD (gastroesophageal reflux disease)    Glaucoma    History of cerebrovascular accident 08/25/2019   Hyperlipidemia 06/03/2013   Hypertension 04/06/2013   Hypokalemia 06/11/2018   Iliac artery aneurysm, bilateral (HCC) 07/16/2016   Inguinal pain of both sides 10/31/2020   Low back pain 05/14/2015   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   Lumbar disc herniation with  radiculopathy 05/14/2015   Mild CAD 10/18/2014   luminal irregularities on angiography March 2011 luminal irregularities on angiography March 2011   Myocardial infarction [facility])    Neck pain 05/14/2015   Neck pain with history of cervical spinal surgery 10/08/2020   Non-cardiac chest pain 06/11/2018   Paroxysmal SVT (supraventricular tachycardia) (HCC) 10/18/2014   catheter ablation procedure for treatment of supraventricular tachycardia 2015 catheter ablation procedure for treatment of supraventricular tachycardia 2015   Peripheral vascular disease (HCC) 06/11/2018   Pharyngitis 06/17/2018   Sciatica 06/17/2018   Seasonal allergies    Shortness of breath 06/11/2018   Smoking 06/05/2021   Stroke (HCC)    Thoracic or lumbosacral neuritis or radiculitis 04/06/2013   Vertebral artery occlusion 05/14/2015   Vertigo 06/17/2018   Past Surgical History:  Procedure Laterality Date   ANTERIOR CERVICAL DECOMPRESSION/DISCECTOMY FUSION 4 LEVELS N/A 10/04/2020   Procedure: ANTERIOR CERVICAL DECOMPRESSION FUSION CERVICAL 3- CERVICAL 4, CERVICAL 4- CERVICAL 5, CERVICAL 5- CERVICAL 6  CERVICAL 6- CERVICAL 7 WITH INSTRUMENTATION AND ALLOGRAFT;  Surgeon: Virl Grimes, MD;  Location: [facility] OR;  Service: Orthopedics;  Laterality: N/A;   CARDIAC SURGERY     COLONOSCOPY  08/27/2015   Colonic polyp status post polypectomy. Small internal hemorrhoids.   ESOPHAGOGASTRODUODENOSCOPY  12/14/2015   Normal EGD. Complete healing of gastric ulcers. No Bx obtained as pt didnt stop his plavix   EYE SURGERY     HERNIA REPAIR     IR ANGIO INTRA EXTRACRAN SEL COM CAROTID INNOMINATE BILAT MOD SED  08/19/2019   IR ANGIO VERTEBRAL SEL  SUBCLAVIAN INNOMINATE UNI L MOD SED  08/19/2019   IR ANGIO VERTEBRAL SEL VERTEBRAL UNI R MOD SED  08/19/2019   STENT PLACEMENT VASCULAR ([facility] HX)     Social History   Socioeconomic History   Marital status: Married    Spouse name: Not on file   Number of children: Not on file   Years  of education: Not on file   Highest education level: Not on file  Occupational History   Not on file  Tobacco Use   Smoking status: Every Day    Current packs/day: 1.00    Types: Cigarettes   Smokeless tobacco: Never  Vaping Use   Vaping status: Every Day  Substance and Sexual Activity   Alcohol use: Not Currently   Drug use: Never   Sexual activity: Not on file  Other Topics Concern   Not on file  Social History Narrative   Not on file   Social Drivers of Health   Financial Resource Strain: Not on file  Food Insecurity: Not on file  Transportation Needs: Not on file  Physical Activity: Not on file  Stress: Not on file  Social Connections: Unknown (06/18/2021)   Received from [facility], [facility]   Social Network    Social Network: Not on file   Family History  Problem Relation Age of Onset   Heart attack Mother    Stroke Father    Cancer Brother    Liver cancer Brother    Stroke Maternal Grandfather    Colon cancer Neg Hx    Rectal cancer Neg Hx    Stomach cancer Neg Hx    Colon polyps Neg Hx    Esophageal cancer Neg Hx    Allergies  Allergen Reactions   Atorvastatin     Myalgias (intolerance)   Tape Rash   Prior to Admission medications   Medication Sig Start Date End Date Taking? Authorizing Provider  amLODipine (NORVASC) 10 MG tablet Take 10 mg by mouth daily. 04/16/18  Yes [provider]  aspirin EC 81 MG tablet Take 81 mg by mouth daily. Swallow whole.   Yes [provider]  brimonidine (ALPHAGAN) 0.2 % ophthalmic solution Place 1 drop into both eyes every morning.   Yes [provider]  clopidogrel (PLAVIX) 75 MG tablet Take 75 mg by mouth daily.   Yes [provider]  empagliflozin (JARDIANCE) 10 MG TABS tablet Take 10 mg by mouth daily.   Yes [provider]  folic acid (FOLVITE) 1 MG tablet Take 1 mg by mouth daily.   Yes [provider]  HYDROcodone -Acetaminophen  10-300 MG TABS Take 1  tablet by mouth 4 (four) times daily as needed (Pain).   Yes [provider]  latanoprost (XALATAN) 0.005 % ophthalmic solution Place 1 drop into both eyes at bedtime.   Yes [provider]  losartan-hydrochlorothiazide (HYZAAR) 100-12.5 MG tablet Take 1 tablet by mouth daily.   Yes [provider]  metoprolol succinate (TOPROL-XL) 50 MG 24 hr tablet Take 50 mg by mouth daily. 05/30/18  Yes [provider]  potassium chloride SA (KLOR-CON M) 20 MEQ tablet Take 20 mEq by mouth daily.   Yes [provider]  pregabalin (LYRICA) 100 MG capsule Take 100 mg by mouth 3 (three) times daily. 03/11/22  Yes [provider]  REPATHA SURECLICK 140 MG/ML SOAJ Inject 140 mg into the skin every 14 (fourteen) days. 01/25/23  Yes [provider]  albuterol (VENTOLIN HFA) 108 (90 Base)  MCG/ACT inhaler Inhale 1-2 puffs into the lungs every 6 (six) hours as needed for wheezing or shortness of breath.    [provider]  cetirizine (ZYRTEC) 10 MG tablet Take 10 mg by mouth daily as needed for allergies. 04/17/18   [provider]  famotidine (PEPCID) 20 MG tablet Take 20 mg by mouth daily as needed for heartburn or indigestion.    [provider]  glimepiride (AMARYL) 1 MG tablet Take 1 mg by mouth daily as needed (for blood sugars over 150). 09/19/20   [provider]  montelukast (SINGULAIR) 10 MG tablet Take 10 mg by mouth daily as needed for allergies. 03/15/21   [provider]  PULMICORT FLEXHALER 90 MCG/ACT inhaler Inhale 1-2 puffs into the lungs 2 (two) times daily as needed (asthma).    [provider]  UBRELVY 50 MG TABS Take 50 mg by mouth daily as needed (migraines). 05/04/23   [provider]     All other systems have been reviewed and were otherwise negative with the exception of those mentioned in the HPI and as above.  Physical Exam: Vitals:   07/09/23 0805  BP: (!) 146/88  Pulse:  60  Resp: 17  Temp: 98.4 F (36.9 C)  SpO2: 98%    Body mass index is 30.41 kg/m.  General: Alert, no acute distress Cardiovascular: No pedal edema Respiratory: No cyanosis, no use of accessory musculature Skin: No lesions in the area of chief complaint Neurologic: Sensation intact distally Psychiatric: Patient is competent for consent with normal mood and affect Lymphatic: No axillary or cervical lymphadenopathy   Assessment/Plan: Right leg pain and weakness due to malpositioned hardware Plan for Procedure(s): Removal of right sided hardware, L5-S1   Murel Arlington, MD 07/09/2023 10:34 AM

## 2023-07-09 NOTE — Anesthesia Postprocedure Evaluation (Signed)
 Anesthesia Post Note  Patient: Kyle Gray  Procedure(s) Performed: REMOVAL OF LUMBAR FIVE - SACRUM ONE INSTRUMENTATION (Spine Lumbar)     Patient location during evaluation: PACU Anesthesia Type: General Level of consciousness: awake and alert, oriented and patient cooperative Pain management: pain level controlled Vital Signs Assessment: post-procedure vital signs reviewed and stable Respiratory status: spontaneous breathing, nonlabored ventilation and respiratory function stable Cardiovascular status: blood pressure returned to baseline and stable Postop Assessment: no apparent nausea or vomiting Anesthetic complications: no   No notable events documented.  Last Vitals:  Vitals:   07/09/23 1330 07/09/23 1345  BP: (!) 165/96 (!) 166/92  Pulse: 70 65  Resp: (!) 23 20  Temp:  (!) 36.3 C  SpO2: 96% 96%    Last Pain:  Vitals:   07/09/23 1326  TempSrc:   PainSc: 4                  Jacquelyne Matte

## 2023-07-09 NOTE — Op Note (Signed)
 PATIENT NAME: Kyle Gray   MEDICAL RECORD NO.:   [id number]    DATE OF BIRTH: [birth year]   DATE OF PROCEDURE: 07/09/2023                               OPERATIVE REPORT     PREOPERATIVE DIAGNOSES: Status post previous L5-S1 fusion by another provider, with malpositioned right sided hardware, resulting in right leg pain and weakness   POSTOPERATIVE DIAGNOSES: Status post previous L5-S1 fusion by another provider, with malpositioned right sided hardware, resulting in right leg pain and weakness   PROCEDURE:   Removal of right sided interconnecting rod and screws  SURGEON:  Virl Grimes, MD.   ASSISTANTGeraline Knapp, PA-C.   ANESTHESIA:  General endotracheal anesthesia.   COMPLICATIONS:  None.   DISPOSITION:  Stable.   ESTIMATED BLOOD LOSS:  Minimal.   INDICATIONS FOR SURGERY:  Briefly, [name] is a pleasant 63 year old male, who did present to me with pain and weakness in the right leg.  He is status post an L5-S1 fusion by another provider.  Upon review of his imaging studies, it was clearly evident that his right S1 pedicle screw was malpositioned, clearly medial to the S1 pedicle.  I did feel this was symptomatic, and very likely responsible for the patient's right leg pain and weakness.  Given this, we did discuss proceeding with removal of his right sided hardware, in the hopes of alleviating his right leg pain, and/or weakness.  He does understand however that removing the hardware would not guarantee alleviation of his right leg symptoms.    OPERATIVE DETAILS:  On 07/09/2023, the patient was brought to surgery and general endotracheal anesthesia was administered.  The patient was placed prone on a well-padded flat Jackson bed with a spinal frame.  Antibiotics were given.  The back was prepped and draped and a time-out procedure was performed.  At this point, a midline incision was made in line with the patient's previous incision.  On the right side, the  paraspinal musculature was bluntly swept laterally.  The right sided hardware was identified.  The caps were then removed, followed by the rods.  The L5 and S1 screws were removed following this.  Of note, there was no extravasation of cerebrospinal fluid upon removal of the screws.  The screw holes were filled with bone wax.  The wound was copiously irrigated with 1 L of normal saline. The wound was then closed in layers using #1 Vicryl followed by 0 Vicryl, followed by 4-0 Monocryl. Benzoin and Steri-Strips were applied followed by a sterile dressing. All instrument counts were correct at the termination of the procedure.    Of note, Geraline Knapp was my assistant throughout surgery, and did aid in retraction, suctioning, and closure from start to finish.     Virl Grimes, MD

## 2023-07-09 NOTE — Anesthesia Procedure Notes (Signed)
 Procedure Name: Intubation Date/Time: 07/09/2023 11:33 AM  Performed by: Trenton Frock, CRNAPre-anesthesia Checklist: Patient identified, Emergency Drugs available, Suction available and Patient being monitored Patient Re-evaluated:Patient Re-evaluated prior to induction Oxygen Delivery Method: Circle system utilized Preoxygenation: Pre-oxygenation with 100% oxygen Induction Type: IV induction Ventilation: Mask ventilation without difficulty Laryngoscope Size: 4 Tube type: Oral Tube size: 7.5 mm Number of attempts: 1 Airway Equipment and Method: Stylet and Bite block Placement Confirmation: ETT inserted through vocal cords under direct vision, positive ETCO2 and breath sounds checked- equal and bilateral Tube secured with: Tape Dental Injury: Teeth and Oropharynx as per pre-operative assessment

## 2023-07-10 MED FILL — Thrombin For Soln 20000 Unit: CUTANEOUS | Qty: 1 | Status: AC

## 2023-07-31 ENCOUNTER — Encounter (HOSPITAL_COMMUNITY): Payer: Self-pay | Admitting: Interventional Radiology

## 2023-08-14 ENCOUNTER — Ambulatory Visit (HOSPITAL_BASED_OUTPATIENT_CLINIC_OR_DEPARTMENT_OTHER)
Admission: RE | Admit: 2023-08-14 | Discharge: 2023-08-14 | Disposition: A | Discharge status: Home / Self Care | Source: Ambulatory Visit | Attending: Family Medicine | Admitting: Family Medicine

## 2023-08-14 ENCOUNTER — Encounter (HOSPITAL_BASED_OUTPATIENT_CLINIC_OR_DEPARTMENT_OTHER): Payer: Self-pay

## 2023-08-14 VITALS — BP 142/82 | HR 69 | Temp 98.1°F | Resp 20 | Ht 68.0 in | Wt 180.0 lb

## 2023-08-14 DIAGNOSIS — M5441 Lumbago with sciatica, right side: Secondary | ICD-10-CM

## 2023-08-14 DIAGNOSIS — G8929 Other chronic pain: Secondary | ICD-10-CM

## 2023-08-14 MED ORDER — KETOROLAC TROMETHAMINE 30 MG/ML IJ SOLN
30.0000 mg | Freq: Once | INTRAMUSCULAR | Status: AC
  Administered 2023-08-14: 30 mg via INTRAMUSCULAR

## 2023-08-14 MED ORDER — KETOROLAC TROMETHAMINE 10 MG PO TABS: 10.0000 mg | ORAL_TABLET | Freq: Four times a day (QID) | ORAL | 0 refills | Status: AC | PRN

## 2023-08-14 NOTE — Discharge Instructions (Signed)
 Chronic lower back pain with right-sided radiculopathy: Ketorolac  30 mg injection during the visit today.  Patient has good kidney function.  Provided ketorolac  10 mg 1 pill every 6 hours with food if needed for back pain.    Follow-up with surgeon next week.  Surgeon will set the plan of care for the future.  No narcotic pain medicine for now given recent prescriptions from his surgeon.

## 2023-08-14 NOTE — ED Triage Notes (Signed)
 Patient states he had back surgery 6/5 has been having increased pain for nearly 2 weeks in back and shooting down right leg. Surgeon refilled pain meds once, he will see her again next week, also follows with pain clinic.

## 2023-08-14 NOTE — ED Provider Notes (Signed)
 [facility]    CSN: [id number] Arrival date & time: 08/14/23  1744      History   Chief Complaint Chief Complaint  Patient presents with   Back Pain    Severe - Entered by patient    HPI Kyle Gray is a 63 y.o. male.   Patient had a previous L5-S1 fusion and had malpositioned right sided hardware resulting in right leg pain and weakness.  On 07/09/2023, he had removal of right sided interconnecting rods and screws.  The patient is having persistently increasing pain in his lower back and left leg with the pain shooting down his left leg.  The pain has been worsening since approximately 07/29/2023 or earlier.  The patient had oxycodone -acetaminophen , 5/325 mg, #28 pills on 07/09/2023.  The patient had hydrocodone -acetaminophen , 5/325 mg, #60 pills on 07/22/2023.  The patient had hydrocodone -acetaminophen , 5/325 mg, #40 pills on 08/06/2023.  The patient is here about pain.   Back Pain Associated symptoms: no abdominal pain, no chest pain and no fever     Past Medical History:  Diagnosis Date   Acute ill-defined cerebrovascular disease 04/06/2013   Allergy    Arteriosclerotic vascular disease 08/25/2019   Arthritis    Asthma    Per patient mild   Atopic dermatitis 06/17/2018   Benign hypertensive heart disease 06/11/2018   Benign paroxysmal positional vertigo 06/17/2018   Bilateral carotid artery stenosis 07/16/2016   CAD (coronary artery disease)    Carpal tunnel syndrome 05/14/2015   Chest discomfort 06/17/2018   Coughing 06/11/2018   CVA (cerebral vascular accident) (HCC)    Diabetes mellitus due to underlying condition with unspecified complications (HCC) 06/17/2018   Diabetes mellitus, new onset (HCC) 06/11/2018   Essential hypertension 06/17/2018   GERD (gastroesophageal reflux disease)    Glaucoma    History of cerebrovascular accident 08/25/2019   Hyperlipidemia 06/03/2013   Hypertension 04/06/2013   Hypokalemia 06/11/2018   Iliac artery aneurysm,  bilateral (HCC) 07/16/2016   Inguinal pain of both sides 10/31/2020   Low back pain 05/14/2015   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   Mild CAD 10/18/2014   luminal irregularities on angiography March 2011 luminal irregularities on angiography March 2011   Myocardial infarction [facility])    Neck pain 05/14/2015   Neck pain with history of cervical spinal surgery 10/08/2020   Non-cardiac chest pain 06/11/2018   Paroxysmal SVT (supraventricular tachycardia) (HCC) 10/18/2014   catheter ablation procedure for treatment of supraventricular tachycardia 2015 catheter ablation procedure for treatment of supraventricular tachycardia 2015   Peripheral vascular disease (HCC) 06/11/2018   Pharyngitis 06/17/2018   Sciatica 06/17/2018   Seasonal allergies    Shortness of breath 06/11/2018   Smoking 06/05/2021   Stroke (HCC)    Thoracic or lumbosacral neuritis or radiculitis 04/06/2013   Vertebral artery occlusion 05/14/2015   Vertigo 06/17/2018    Patient Active Problem List   Diagnosis Date Noted   Smoking 06/05/2021   Allergy 06/04/2021   CVA (cerebral vascular accident) (HCC) 06/04/2021   GERD (gastroesophageal reflux disease) 06/04/2021   Glaucoma 06/04/2021   Myocardial infarction (HCC) 06/04/2021   Arthritis 11/20/2020   Asthma 11/20/2020   Inguinal pain of both sides 10/31/2020   Neck pain with history of cervical spinal surgery 10/08/2020   CAD (coronary artery disease)    Stroke [facility])    Seasonal allergies    Arteriosclerotic vascular disease 08/25/2019   History of cerebrovascular accident 08/25/2019   Atopic dermatitis  06/17/2018   Benign paroxysmal positional vertigo 06/17/2018   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   Pharyngitis 06/17/2018   Sciatica 06/17/2018   Vertigo 06/17/2018   Essential hypertension 06/17/2018   Chest discomfort 06/17/2018   Diabetes mellitus due to underlying condition with unspecified complications  (HCC) 06/17/2018   Diabetes mellitus, new onset (HCC) 06/11/2018   Benign hypertensive heart disease 06/11/2018   Coughing 06/11/2018   Hypokalemia 06/11/2018   Non-cardiac chest pain 06/11/2018   Peripheral vascular disease (HCC) 06/11/2018   Shortness of breath 06/11/2018   Bilateral carotid artery stenosis 07/16/2016   Iliac artery aneurysm, bilateral (HCC) 07/16/2016   Carpal tunnel syndrome 05/14/2015   Low back pain 05/14/2015   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   Neck pain 05/14/2015   Vertebral artery occlusion 05/14/2015   Mild CAD 10/18/2014   Paroxysmal SVT (supraventricular tachycardia) (HCC) 10/18/2014   Hyperlipidemia 06/03/2013   Acute ill-defined cerebrovascular disease 04/06/2013   Hypertension 04/06/2013   Thoracic or lumbosacral neuritis or radiculitis 04/06/2013    Past Surgical History:  Procedure Laterality Date   ANTERIOR CERVICAL DECOMPRESSION/DISCECTOMY FUSION 4 LEVELS N/A 10/04/2020   Procedure: ANTERIOR CERVICAL DECOMPRESSION FUSION CERVICAL 3- CERVICAL 4, CERVICAL 4- CERVICAL 5, CERVICAL 5- CERVICAL 6  CERVICAL 6- CERVICAL 7 WITH INSTRUMENTATION AND ALLOGRAFT;  Surgeon: Beuford Anes, MD;  Location: [facility] OR;  Service: Orthopedics;  Laterality: N/A;   CARDIAC SURGERY     COLONOSCOPY  08/27/2015   Colonic polyp status post polypectomy. Small internal hemorrhoids.   ESOPHAGOGASTRODUODENOSCOPY  12/14/2015   Normal EGD. Complete healing of gastric ulcers. No Bx obtained as pt didnt stop his plavix   EYE SURGERY     HERNIA REPAIR     IR ANGIO INTRA EXTRACRAN SEL COM CAROTID INNOMINATE BILAT MOD SED  08/19/2019   IR ANGIO VERTEBRAL SEL SUBCLAVIAN INNOMINATE UNI L MOD SED  08/19/2019   IR ANGIO VERTEBRAL SEL VERTEBRAL UNI R MOD SED  08/19/2019   STENT PLACEMENT VASCULAR ([facility] HX)         Home Medications    Prior to Admission medications   Medication Sig Start Date End Date Taking? Authorizing Provider  ketorolac  (TORADOL ) 10 MG  tablet Take 1 tablet (10 mg total) by mouth every 6 (six) hours as needed. 08/14/23  Yes Ival Domino, FNP  albuterol (VENTOLIN HFA) 108 (90 Base) MCG/ACT inhaler Inhale 1-2 puffs into the lungs every 6 (six) hours as needed for wheezing or shortness of breath.    [provider]  amLODipine (NORVASC) 10 MG tablet Take 10 mg by mouth daily. 04/16/18   [provider]  aspirin EC 81 MG tablet Take 81 mg by mouth daily. Swallow whole.    [provider]  brimonidine (ALPHAGAN) 0.2 % ophthalmic solution Place 1 drop into both eyes every morning.    [provider]  cetirizine (ZYRTEC) 10 MG tablet Take 10 mg by mouth daily as needed for allergies. 04/17/18   [provider]  clopidogrel (PLAVIX) 75 MG tablet Take 75 mg by mouth daily.    [provider]  empagliflozin (JARDIANCE) 10 MG TABS tablet Take 10 mg by mouth daily.    [provider]  famotidine (PEPCID) 20 MG tablet Take 20 mg by mouth daily as needed for heartburn or indigestion.    [provider]  folic acid (FOLVITE) 1 MG tablet Take 1 mg by mouth daily.    [provider]  glimepiride (AMARYL) 1 MG tablet Take 1 mg  by mouth daily as needed (for blood sugars over 150). 09/19/20   [provider]  latanoprost (XALATAN) 0.005 % ophthalmic solution Place 1 drop into both eyes at bedtime.    [provider]  losartan-hydrochlorothiazide (HYZAAR) 100-12.5 MG tablet Take 1 tablet by mouth daily.    [provider]  methocarbamol  (ROBAXIN ) 500 MG tablet Take 1-2 tablets (500-1,000 mg total) by mouth 4 (four) times daily. 07/09/23   McKenzie, Kayla J, PA-C  metoprolol succinate (TOPROL-XL) 50 MG 24 hr tablet Take 50 mg by mouth daily. 05/30/18   [provider]  montelukast (SINGULAIR) 10 MG tablet Take 10 mg by mouth daily as needed for allergies. 03/15/21   [provider]  potassium chloride SA (KLOR-CON M) 20 MEQ tablet Take  20 mEq by mouth daily.    [provider]  pregabalin (LYRICA) 100 MG capsule Take 100 mg by mouth 3 (three) times daily. 03/11/22   [provider]  PULMICORT FLEXHALER 90 MCG/ACT inhaler Inhale 1-2 puffs into the lungs 2 (two) times daily as needed (asthma).    [provider]  REPATHA SURECLICK 140 MG/ML SOAJ Inject 140 mg into the skin every 14 (fourteen) days. 01/25/23   [provider]  UBRELVY 50 MG TABS Take 50 mg by mouth daily as needed (migraines). 05/04/23   [provider]    Family History Family History  Problem Relation Age of Onset   Heart attack Mother    Stroke Father    Cancer Brother    Liver cancer Brother    Stroke Maternal Grandfather    Colon cancer Neg Hx    Rectal cancer Neg Hx    Stomach cancer Neg Hx    Colon polyps Neg Hx    Esophageal cancer Neg Hx     Social History Social History   Tobacco Use   Smoking status: Every Day    Current packs/day: 1.00    Types: Cigarettes   Smokeless tobacco: Never  Vaping Use   Vaping status: Never Used  Substance Use Topics   Alcohol use: Not Currently   Drug use: Never     Allergies   Atorvastatin and Tape   Review of Systems Review of Systems  Constitutional:  Negative for fever.  Respiratory:  Negative for cough.   Cardiovascular:  Negative for chest pain.  Gastrointestinal:  Negative for abdominal pain, constipation, diarrhea, nausea and vomiting.  Musculoskeletal:  Positive for back pain. Negative for arthralgias.  Skin:  Negative for color change and rash.  Neurological:  Negative for syncope.  All other systems reviewed and are negative.    Physical Exam Triage Vital Signs ED Triage Vitals  Encounter Vitals Group     BP 08/14/23 1842 (!) 142/82     Girls Systolic BP Percentile --      Girls Diastolic BP Percentile --      Boys Systolic BP Percentile --      Boys Diastolic BP Percentile --      Pulse Rate 08/14/23 1842 69     Resp 08/14/23  1842 20     Temp 08/14/23 1842 98.1 F (36.7 C)     Temp Source 08/14/23 1842 Oral     SpO2 08/14/23 1842 96 %     Weight 08/14/23 1839 180 lb (81.6 kg)     Height 08/14/23 1839 5' 8 (1.727 m)     Head Circumference --      Peak Flow --  Pain Score 08/14/23 1838 9     Pain Loc --      Pain Education --      Exclude from Growth Chart --    No data found.  Updated Vital Signs BP (!) 142/82 (BP Location: Right Arm)   Pulse 69   Temp 98.1 F (36.7 C) (Oral)   Resp 20   Ht 5' 8 (1.727 m)   Wt 180 lb (81.6 kg)   SpO2 96%   BMI 27.37 kg/m   Visual Acuity Right Eye Distance:   Left Eye Distance:   Bilateral Distance:    Right Eye Near:   Left Eye Near:    Bilateral Near:     Physical Exam Vitals and nursing note reviewed.  Constitutional:      General: He is not in acute distress.    Appearance: He is well-developed. He is not ill-appearing or toxic-appearing.  HENT:     Head: Normocephalic and atraumatic.     Right Ear: External ear normal.     Left Ear: External ear normal.     Nose: Nose normal.     Mouth/Throat:     Lips: Pink.     Mouth: Mucous membranes are moist.  Eyes:     Conjunctiva/sclera: Conjunctivae normal.     Pupils: Pupils are equal, round, and reactive to light.  Cardiovascular:     Rate and Rhythm: Normal rate and regular rhythm.     Heart sounds: S1 normal and S2 normal. No murmur heard. Pulmonary:     Effort: Pulmonary effort is normal. No respiratory distress.     Breath sounds: Normal breath sounds. No decreased breath sounds, wheezing, rhonchi or rales.  Musculoskeletal:        General: No swelling.  Skin:    General: Skin is warm and dry.     Capillary Refill: Capillary refill takes less than 2 seconds.     Findings: No rash.  Neurological:     Mental Status: He is alert and oriented to person, place, and time.  Psychiatric:        Mood and Affect: Mood normal.      UC Treatments / Results  Labs (all labs ordered are  listed, but only abnormal results are displayed) Basic metabolic panel per protocol: 07/08/23:     Component Ref Range & Units (hover) 1 mo ago (07/08/23)  Sodium 142  Potassium 3.9  Chloride 101  CO2 33 High   Glucose, Bld 79  Comment: Glucose reference range applies only to samples taken after fasting for at least 8 hours.  BUN 6 Low   Creatinine, Ser 1.01  Calcium 9.5  GFR, Estimated >60  Comment: (NOTE) Calculated using the CKD-EPI Creatinine Equation (2021)  Anion gap 8  Comment: Performed at [facility], [address], [place], [place] [address]  Resulting Agency [facility]      EKG   Radiology No results found.  Procedures Procedures (including critical care time)  Medications Ordered in UC Medications  ketorolac  (TORADOL ) 30 MG/ML injection 30 mg (30 mg Intramuscular Given 08/14/23 1920)    Initial Impression / Assessment and Plan / UC Course  I have reviewed the triage vital signs and the nursing notes.  Pertinent labs & imaging results that were available during my care of the patient were reviewed by me and considered in my medical decision making (see chart for details).  Plan of Care: Lower back pain with Right Radiculopathy: Patient has  good kidney function.  Ketorolac  30 mg injection now.  May use ketorolac  10 mg every 6 hours if needed for back pain.  Follow-up with surgeon next week.  No additional narcotics.  Surgeon will set the plan of care for pain management next week.  I reviewed the plan of care with the patient and/or the patient's guardian.  The patient and/or guardian had time to ask questions and acknowledged that the questions were answered.  I provided instruction on symptoms or reasons to return here or to go to an ER, if symptoms/condition did not improve, worsened or if new symptoms occurred.  Final Clinical Impressions(s) / UC Diagnoses   Final diagnoses:  Chronic midline low back pain with right-sided sciatica     Discharge  Instructions      Chronic lower back pain with right-sided radiculopathy: Ketorolac  30 mg injection during the visit today.  Patient has good kidney function.  Provided ketorolac  10 mg 1 pill every 6 hours with food if needed for back pain.    Follow-up with surgeon next week.  Surgeon will set the plan of care for the future.  No narcotic pain medicine for now given recent prescriptions from his surgeon.     ED Prescriptions     Medication Sig Dispense Auth. Provider   ketorolac  (TORADOL ) 10 MG tablet Take 1 tablet (10 mg total) by mouth every 6 (six) hours as needed. 10 tablet Jalil Lorusso, FNP      I have reviewed the PDMP during this encounter.   Ival Domino, FNP 08/14/23 1921

## 2023-08-27 ENCOUNTER — Encounter: Payer: Self-pay | Admitting: Cardiology

## 2023-08-27 ENCOUNTER — Ambulatory Visit: Attending: Cardiology | Admitting: Cardiology

## 2023-08-27 VITALS — BP 126/68 | HR 68 | Ht 68.0 in | Wt 206.8 lb

## 2023-08-27 DIAGNOSIS — E782 Mixed hyperlipidemia: Secondary | ICD-10-CM

## 2023-08-27 DIAGNOSIS — I471 Supraventricular tachycardia, unspecified: Secondary | ICD-10-CM | POA: Diagnosis not present

## 2023-08-27 DIAGNOSIS — F17210 Nicotine dependence, cigarettes, uncomplicated: Secondary | ICD-10-CM | POA: Diagnosis not present

## 2023-08-27 DIAGNOSIS — I2510 Atherosclerotic heart disease of native coronary artery without angina pectoris: Secondary | ICD-10-CM

## 2023-08-27 DIAGNOSIS — I10 Essential (primary) hypertension: Secondary | ICD-10-CM

## 2023-08-27 NOTE — Patient Instructions (Signed)
 Medication Instructions:  Your physician recommends that you continue on your current medications as directed. Please refer to the Current Medication list given to you today.  *If you need a refill on your cardiac medications before your next appointment, please call your pharmacy*  Lab Work: None If you have labs (blood work) drawn today and your tests are completely normal, you will receive your results only by: MyChart Message (if you have MyChart) OR A paper copy in the mail If you have any lab test that is abnormal or we need to change your treatment, we will call you to review the results.  Testing/Procedures: None  Follow-Up: At [facility], you and your health needs are our priority.  As part of our continuing mission to provide you with exceptional heart care, our providers are all part of one team.  This team includes your primary Cardiologist (physician) and Advanced Practice Providers or APPs (Physician Assistants and Nurse Practitioners) who all work together to provide you with the care you need, when you need it.  Your next appointment:   9 month(s)  Provider:   Belva Crome, MD    We recommend signing up for the patient portal called "MyChart".  Sign up information is provided on this After Visit Summary.  MyChart is used to connect with patients for Virtual Visits (Telemedicine).  Patients are able to view lab/test results, encounter notes, upcoming appointments, etc.  Non-urgent messages can be sent to your provider as well.   To learn more about what you can do with MyChart, go to [email or web address].   Other Instructions None

## 2023-08-27 NOTE — Progress Notes (Signed)
 Cardiology Office Note:    Date:  08/27/2023   ID:  Kyle Gray, DOB Sep 19, 1960, MRN [id number]  PCP:  Marelyn Quill, MD  Cardiologist:  Jennifer JONELLE Crape, MD   Referring MD: Marelyn Quill, MD    ASSESSMENT:    1. Coronary artery disease involving native coronary artery of native heart without angina pectoris   2. Essential hypertension   3. Paroxysmal SVT (supraventricular tachycardia) (HCC)   4. Mixed hyperlipidemia   5. Cigarette smoker    PLAN:    In order of problems listed above:  Coronary artery disease: Secondary prevention stressed with the patient.  Importance of compliance with diet medication stressed any vocalized understanding.  He was advised to walk at least half an hour a day on a daily basis and he promises to do so. Cigarette smoker: I spent 5 minutes with the patient discussing solely about smoking. Smoking cessation was counseled. I suggested to the patient also different medications and pharmacological interventions. Patient is keen to try stopping on its own at this time. He will get back to me if he needs any further assistance in this matter. Essential hypertension: Blood pressure stable and diet was emphasized. Mixed dyslipidemia: On lipid-lowering medications followed by primary care.  Goal LDL must be less than 60. Iliac artery aneurysm: Stable and patient not keen on any further evaluation at this time. Patient will be seen in follow-up appointment in 6 months or earlier if the patient has any concerns.    Medication Adjustments/Labs and Tests Ordered: Current medicines are reviewed at length with the patient today.  Concerns regarding medicines are outlined above.  No orders of the defined types were placed in this encounter.  No orders of the defined types were placed in this encounter.    No chief complaint on file.    History of Present Illness:    Kyle Gray is a 63 y.o. male.  Patient has past medical history of coronary  artery disease, essential hypertension, mixed dyslipidemia and iliac artery aneurysms.  He denies any problems at this time and takes care of activities of daily living.  No chest pain orthopnea or PND.  Unfortunately continues to smoke.  At the time of my evaluation, the patient is alert awake oriented and in no distress.  Past Medical History:  Diagnosis Date   Acute ill-defined cerebrovascular disease 04/06/2013   Allergy    Arteriosclerotic vascular disease 08/25/2019   Arthritis    Asthma    Per patient mild   Atopic dermatitis 06/17/2018   Benign hypertensive heart disease 06/11/2018   Benign paroxysmal positional vertigo 06/17/2018   Bilateral carotid artery stenosis 07/16/2016   CAD (coronary artery disease)    Carpal tunnel syndrome 05/14/2015   Chest discomfort 06/17/2018   Coughing 06/11/2018   CVA (cerebral vascular accident) (HCC)    Diabetes mellitus due to underlying condition with unspecified complications (HCC) 06/17/2018   Diabetes mellitus, new onset (HCC) 06/11/2018   Essential hypertension 06/17/2018   GERD (gastroesophageal reflux disease)    Glaucoma    History of cerebrovascular accident 08/25/2019   Hyperlipidemia 06/03/2013   Hypertension 04/06/2013   Hypokalemia 06/11/2018   Iliac artery aneurysm, bilateral (HCC) 07/16/2016   Inguinal pain of both sides 10/31/2020   Low back pain 05/14/2015   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   Mild CAD 10/18/2014   luminal irregularities on angiography March 2011 luminal irregularities on angiography March 2011   Myocardial infarction [facility])  Neck pain 05/14/2015   Neck pain with history of cervical spinal surgery 10/08/2020   Non-cardiac chest pain 06/11/2018   Paroxysmal SVT (supraventricular tachycardia) (HCC) 10/18/2014   catheter ablation procedure for treatment of supraventricular tachycardia 2015 catheter ablation procedure for treatment of  supraventricular tachycardia 2015   Peripheral vascular disease (HCC) 06/11/2018   Pharyngitis 06/17/2018   Sciatica 06/17/2018   Seasonal allergies    Shortness of breath 06/11/2018   Smoking 06/05/2021   Stroke [facility])    Thoracic or lumbosacral neuritis or radiculitis 04/06/2013   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 Small internal hemorrhoids.   ESOPHAGOGASTRODUODENOSCOPY  12/14/2015   Normal EGD. Complete healing of gastric ulcers. No Bx obtained as pt didnt stop his plavix   EYE SURGERY     HERNIA REPAIR     IR ANGIO INTRA EXTRACRAN SEL COM CAROTID INNOMINATE BILAT MOD SED  08/19/2019   IR ANGIO VERTEBRAL SEL SUBCLAVIAN INNOMINATE UNI L MOD SED  08/19/2019   IR ANGIO VERTEBRAL SEL VERTEBRAL UNI R MOD SED  08/19/2019   STENT PLACEMENT VASCULAR ([facility] HX)      Current Medications: Current Meds  Medication Sig   albuterol (VENTOLIN HFA) 108 (90 Base) MCG/ACT inhaler Inhale 1-2 puffs into the lungs every 6 (six) hours as needed for wheezing or shortness of breath.   amLODipine (NORVASC) 10 MG tablet Take 10 mg by mouth daily.   aspirin EC 81 MG tablet Take 81 mg by mouth daily. Swallow whole.   brimonidine (ALPHAGAN) 0.2 % ophthalmic solution Place 1 drop into both eyes every morning.   cetirizine (ZYRTEC) 10 MG tablet Take 10 mg by mouth daily as needed for allergies.   clopidogrel (PLAVIX) 75 MG tablet Take 75 mg by mouth daily.   empagliflozin (JARDIANCE) 10 MG TABS tablet  Take 10 mg by mouth daily.   famotidine (PEPCID) 20 MG tablet Take 20 mg by mouth daily as needed for heartburn or indigestion.   folic acid (FOLVITE) 1 MG tablet Take 1 mg by mouth daily.   glimepiride (AMARYL) 1 MG tablet Take 1 mg by mouth daily as needed (for blood sugars over 150).   HYDROcodone -Acetaminophen  10-300 MG TABS Take 1 tablet by mouth 4 (four) times daily as needed (pain).   ketorolac  (TORADOL ) 10 MG tablet Take 1 tablet (10 mg total) by mouth every 6 (six) hours as needed.   latanoprost (XALATAN) 0.005 % ophthalmic solution Place 1 drop into both eyes at bedtime.   lidocaine  (LIDODERM ) 5 % Place 2 patches onto the skin as needed (for wrist).   losartan-hydrochlorothiazide (HYZAAR) 100-12.5 MG tablet Take 1 tablet by mouth daily.   methocarbamol  (ROBAXIN ) 500 MG tablet Take 1-2 tablets (500-1,000 mg total) by mouth 4 (four) times daily.   metoprolol succinate (TOPROL-XL) 50 MG 24 hr tablet Take 50 mg by mouth daily.   montelukast (SINGULAIR) 10 MG tablet Take 10 mg by mouth daily as needed for allergies.   potassium chloride SA (KLOR-CON M) 20 MEQ tablet Take  20 mEq by mouth daily.   pregabalin (LYRICA) 100 MG capsule Take 100 mg by mouth 3 (three) times daily.   PULMICORT FLEXHALER 90 MCG/ACT inhaler Inhale 1-2 puffs into the lungs 2 (two) times daily as needed (asthma).   REPATHA SURECLICK 140 MG/ML SOAJ Inject 140 mg into the skin every 14 (fourteen) days.   UBRELVY 50 MG TABS Take 50 mg by mouth daily as needed (migraines).     Allergies:   Atorvastatin and Tape   Social History   Socioeconomic History   Marital status: Married    Spouse name: Not on file   Number of children: Not on file   Years of education: Not on file   Highest education level: Not on file  Occupational History   Not on file  Tobacco Use   Smoking status: Every Day    Current packs/day: 1.00    Types: Cigarettes   Smokeless tobacco: Never  Vaping Use   Vaping status: Never Used   Substance and Sexual Activity   Alcohol use: Not Currently   Drug use: Never   Sexual activity: Not on file  Other Topics Concern   Not on file  Social History Narrative   Not on file   Social Drivers of Health   Financial Resource Strain: Not on file  Food Insecurity: Not on file  Transportation Needs: Not on file  Physical Activity: Not on file  Stress: Not on file  Social Connections: Unknown (06/18/2021)   [facility]   Social Network    Social Network: Not on file     Family History: The patient's family history includes Cancer in his brother; Heart attack in his mother; Liver cancer in his brother; Stroke in his father and maternal grandfather. There is no history of Colon cancer, Rectal cancer, Stomach cancer, Colon polyps, or Esophageal cancer.  ROS:   Please see the history of present illness.    All other systems reviewed and are negative.  EKGs/Labs/Other Studies Reviewed:    The following studies were reviewed today: .SABRA   I discussed my findings with the patient at length   Recent Labs: 07/08/2023: BUN 6; Creatinine, Ser 1.01; Hemoglobin 15.2; Platelets 169; Potassium 3.9; Sodium 142  Recent Lipid Panel    Component Value Date/Time   CHOL 105 06/05/2021 0900   TRIG 150 (H) 06/05/2021 0900   HDL 39 (L) 06/05/2021 0900   CHOLHDL 2.7 06/05/2021 0900   LDLCALC 40 06/05/2021 0900    Physical Exam:    VS:  BP 126/68   Pulse 68   Ht 5' 8 (1.727 m)   Wt 206 lb 12.8 oz (93.8 kg)   SpO2 94%   BMI 31.44 kg/m     Wt Readings from Last 3 Encounters:  08/27/23 206 lb 12.8 oz (93.8 kg)  08/14/23 180 lb (81.6 kg)  07/09/23 200 lb (90.7 kg)     GEN: Patient is in no acute distress HEENT: Normal NECK: No JVD; No carotid bruits LYMPHATICS: No lymphadenopathy CARDIAC: Hear sounds regular, 2/6 systolic murmur at the apex. RESPIRATORY:  Clear to auscultation without rales, wheezing or rhonchi  ABDOMEN: Soft, non-tender,  non-distended MUSCULOSKELETAL:  No edema; No deformity  SKIN: Warm and dry NEUROLOGIC:  Alert and oriented x 3 PSYCHIATRIC:  Normal affect   Signed, Jennifer JONELLE Crape, MD  08/27/2023 4:05 PM     [facility]

## 2023-10-19 ENCOUNTER — Telehealth: Payer: Self-pay | Admitting: Cardiology

## 2023-10-19 ENCOUNTER — Telehealth (HOSPITAL_BASED_OUTPATIENT_CLINIC_OR_DEPARTMENT_OTHER): Payer: Self-pay

## 2023-10-19 NOTE — Telephone Encounter (Signed)
 Called patient to set up an televisit for a pre-op clearance (Right knee scope) on 10/22/23 @ 2:20. Meds, Rec, and consent done.      Patient Consent for Virtual Visit        Kyle Gray has provided verbal consent on 10/19/2023 for a virtual visit (video or telephone).   CONSENT FOR VIRTUAL VISIT FOR:  Kyle Gray  By participating in this virtual visit I agree to the following:  I hereby voluntarily request, consent and authorize [facility] and its employed or contracted physicians, physician assistants, nurse practitioners or other licensed health care professionals (the Practitioner), to provide me with telemedicine health care services (the "Services) as deemed necessary by the treating Practitioner. I acknowledge and consent to receive the Services by the Practitioner via telemedicine. I understand that the telemedicine visit will involve communicating with the Practitioner through live audiovisual communication technology and the disclosure of certain medical information by electronic transmission. I acknowledge that I have been given the opportunity to request an in-person assessment or other available alternative prior to the telemedicine visit and am voluntarily participating in the telemedicine visit.  I understand that I have the right to withhold or withdraw my consent to the use of telemedicine in the course of my care at any time, without affecting my right to future care or treatment, and that the Practitioner or I may terminate the telemedicine visit at any time. I understand that I have the right to inspect all information obtained and/or recorded in the course of the telemedicine visit and may receive copies of available information for a reasonable fee.  I understand that some of the potential risks of receiving the Services via telemedicine include:  Delay or interruption in medical evaluation due to technological equipment failure or disruption; Information  transmitted may not be sufficient (e.g. poor resolution of images) to allow for appropriate medical decision making by the Practitioner; and/or  In rare instances, security protocols could fail, causing a breach of personal health information.  Furthermore, I acknowledge that it is my responsibility to provide information about my medical history, conditions and care that is complete and accurate to the best of my ability. I acknowledge that Practitioner's advice, recommendations, and/or decision may be based on factors not within their control, such as incomplete or inaccurate data provided by me or distortions of diagnostic images or specimens that may result from electronic transmissions. I understand that the practice of medicine is not an exact science and that Practitioner makes no warranties or guarantees regarding treatment outcomes. I acknowledge that a copy of this consent can be made available to me via my patient portal [facility] MyChart), or I can request a printed copy by calling the office of [facility].    I understand that my insurance will be billed for this visit.   I have read or had this consent read to me. I understand the contents of this consent, which adequately explains the benefits and risks of the Services being provided via telemedicine.  I have been provided ample opportunity to ask questions regarding this consent and the Services and have had my questions answered to my satisfaction. I give my informed consent for the services to be provided through the use of telemedicine in my medical care

## 2023-10-19 NOTE — Telephone Encounter (Signed)
   Name: Kyle Gray  DOB: March 07, 1960  MRN: [id number]  Primary Cardiologist: Jennifer JONELLE Crape, MD   Preoperative team, please contact this patient and set up a phone call appointment for further preoperative risk assessment. Please obtain consent and complete medication review. Thank you for your help.  I confirm that guidance regarding antiplatelet and oral anticoagulation therapy has been completed and, if necessary, noted below.  It appears he his on plavix for CVA. Heart cath 2011 with luminal irregularities per chart review.   I also confirmed the patient resides in the state of [place] . As per [facility] Medical Board telemedicine laws, the patient must reside in the state in which the provider is licensed.   [name], PA 10/19/2023, 11:02 AM [facility]

## 2023-10-19 NOTE — Telephone Encounter (Signed)
   Pre-operative Risk Assessment    Patient Name: Kyle Gray  DOB: 1960-07-14 MRN: [id number]   Date of last office visit: 08/24/23 Date of next office visit: n/a   Request for Surgical Clearance    Procedure:  Right knee scope  Date of Surgery:  Clearance 10/29/23                                Surgeon:  Dr. Dalldorf  Surgeon's Group or Practice Name:  [facility] Phone number:  [phone] Fax number:  [phone]   Type of Clearance Requested:   - Medical  - Pharmacy:  Hold Clopidogrel (Plavix) TBD   Type of Anesthesia:  Choice    Additional requests/questions:     Bonney Barbee DELENA Claudene   10/19/2023, 9:37 AM

## 2023-10-19 NOTE — Telephone Encounter (Signed)
 1st attempt : Called patient to schedule cardiac clearance preop telehealth appointment. NA, left message on VM to contact our office asap.

## 2023-10-19 NOTE — Telephone Encounter (Signed)
 Called patient to set up an televisit for a pre-op clearance (Right knee scope) on 10/22/23 @ 2:20. Meds, Rec, and consent done.

## 2023-10-22 ENCOUNTER — Ambulatory Visit: Attending: Cardiology | Admitting: Emergency Medicine

## 2023-10-22 DIAGNOSIS — Z01810 Encounter for preprocedural cardiovascular examination: Secondary | ICD-10-CM | POA: Diagnosis not present

## 2023-10-22 NOTE — Progress Notes (Signed)
 Virtual Visit via Telephone Note   Because of Kyle Gray co-morbid illnesses, he is at least at moderate risk for complications without adequate follow up.  This format is felt to be most appropriate for this patient at this time.  Due to technical limitations with video connection (technology), today's appointment will be conducted as an audio only telehealth visit, and Kyle Gray verbally agreed to proceed in this manner.   All issues noted in this document were discussed and addressed.  No physical exam could be performed with this format.  Evaluation Performed:  Preoperative cardiovascular risk assessment _____________   Date:  10/22/2023   Patient ID:  Kyle Gray, DOB [birth year], MRN [id number] Patient Location:  Home Provider location:   Office  Primary Care Provider:  Marelyn Quill, MD Primary Cardiologist:  Jennifer JONELLE Crape, MD  Chief Complaint / Patient Profile   63 y.o. y/o male with a h/o coronary artery disease, essential hypertension, mixed dyslipidemia and iliac artery aneurysms, T2DM, paroxysmal SVT, who is pending a right knee scope and presents today for telephonic preoperative cardiovascular risk assessment.  History of Present Illness    Kyle Gray is a 63 y.o. male who presents via audio/video conferencing for a telehealth visit today.  Pt was last seen in cardiology clinic on 08/27/2023 by Dr. Revankar. At that time Kyle Gray was doing well. The patient is now pending procedure as outlined above.  Since his last visit, he denies chest pain, palpitations, dyspnea, pnd, orthopnea, n, v, dizziness, syncope, edema, weight gain, or early satiety. All other systems reviewed and are otherwise negative except as noted above.    Past Medical History    Past Medical History:  Diagnosis Date   Acute ill-defined cerebrovascular disease 04/06/2013   Allergy    Arteriosclerotic vascular disease 08/25/2019   Arthritis    Asthma    Per patient  mild   Atopic dermatitis 06/17/2018   Benign hypertensive heart disease 06/11/2018   Benign paroxysmal positional vertigo 06/17/2018   Bilateral carotid artery stenosis 07/16/2016   CAD (coronary artery disease)    Carpal tunnel syndrome 05/14/2015   Chest discomfort 06/17/2018   Coughing 06/11/2018   CVA (cerebral vascular accident) (HCC)    Diabetes mellitus due to underlying condition with unspecified complications (HCC) 06/17/2018   Diabetes mellitus, new onset (HCC) 06/11/2018   Essential hypertension 06/17/2018   GERD (gastroesophageal reflux disease)    Glaucoma    History of cerebrovascular accident 08/25/2019   Hyperlipidemia 06/03/2013   Hypertension 04/06/2013   Hypokalemia 06/11/2018   Iliac artery aneurysm, bilateral (HCC) 07/16/2016   Inguinal pain of both sides 10/31/2020   Low back pain 05/14/2015   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   Mild CAD 10/18/2014   luminal irregularities on angiography March 2011 luminal irregularities on angiography March 2011   Myocardial infarction [facility])    Neck pain 05/14/2015   Neck pain with history of cervical spinal surgery 10/08/2020   Non-cardiac chest pain 06/11/2018   Paroxysmal SVT (supraventricular tachycardia) (HCC) 10/18/2014   catheter ablation procedure for treatment of supraventricular tachycardia 2015 catheter ablation procedure for treatment of supraventricular tachycardia 2015   Peripheral vascular disease (HCC) 06/11/2018   Pharyngitis 06/17/2018   Sciatica 06/17/2018   Seasonal allergies    Shortness of breath 06/11/2018   Smoking 06/05/2021   Stroke (HCC)    Thoracic or lumbosacral neuritis or radiculitis 04/06/2013   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 Small internal hemorrhoids.   ESOPHAGOGASTRODUODENOSCOPY  12/14/2015   Normal EGD. Complete healing of gastric ulcers. No Bx obtained as pt didnt stop his plavix   EYE SURGERY     HERNIA REPAIR     IR ANGIO INTRA EXTRACRAN SEL COM CAROTID INNOMINATE BILAT MOD SED  08/19/2019   IR ANGIO VERTEBRAL SEL SUBCLAVIAN INNOMINATE UNI L MOD SED  08/19/2019   IR ANGIO VERTEBRAL SEL VERTEBRAL UNI R MOD SED  08/19/2019   STENT PLACEMENT VASCULAR ([facility] HX)      Allergies  Allergies  Allergen Reactions   Atorvastatin     Myalgias (intolerance)   Tape Rash    Home Medications    Prior to Admission medications   Medication Sig Start Date End Date Taking? Authorizing Provider  albuterol (VENTOLIN HFA) 108 (90 Base) MCG/ACT inhaler Inhale 1-2 puffs into the lungs every 6 (six) hours as needed for wheezing or shortness of breath.    [provider]  amLODipine (NORVASC) 10 MG tablet Take 10 mg by mouth daily. 04/16/18   [provider]  aspirin EC 81 MG tablet Take 81 mg by mouth daily. Swallow whole.    [provider]  brimonidine (ALPHAGAN) 0.2 % ophthalmic solution Place 1 drop into both eyes every morning.    [provider]  cetirizine (ZYRTEC) 10 MG tablet Take 10 mg by mouth daily as needed for allergies. 04/17/18   [provider]  clopidogrel (PLAVIX) 75 MG tablet Take 75 mg by mouth daily.    [provider]  empagliflozin (JARDIANCE) 10 MG TABS tablet Take 10 mg by mouth daily.    [provider]  famotidine (PEPCID) 20 MG tablet Take 20 mg by mouth daily as needed for heartburn or indigestion.    [provider]  folic  acid (FOLVITE) 1 MG tablet Take 1 mg by mouth daily.    [provider]  glimepiride (AMARYL) 1 MG tablet Take 1 mg by mouth daily as needed (for blood sugars over 150). 09/19/20   [provider]  HYDROcodone -Acetaminophen  10-300 MG TABS Take 1 tablet by mouth 4 (four) times daily as needed (pain). 08/18/23   [provider]  ketorolac  (TORADOL ) 10 MG tablet Take 1 tablet (10 mg total) by mouth every 6 (six) hours as needed. 08/14/23   Kyle Domino, FNP  latanoprost (XALATAN) 0.005 % ophthalmic solution Place 1 drop into both eyes at bedtime.    [provider]  lidocaine  (LIDODERM ) 5 % Place 2 patches onto the skin as needed (for wrist). 08/19/23   [provider]  losartan-hydrochlorothiazide (HYZAAR) 100-12.5 MG tablet Take 1 tablet by mouth daily.    [provider]  methocarbamol  (ROBAXIN ) 500 MG tablet Take 1-2 tablets (500-1,000 mg total) by mouth 4 (four) times daily. 07/09/23   McKenzie, Kayla J, PA-C  metoprolol succinate (TOPROL-XL) 50 MG 24 hr tablet Take 50 mg by mouth daily. 05/30/18   [provider]  montelukast (SINGULAIR) 10 MG tablet Take 10 mg by mouth daily  as needed for allergies. 03/15/21   [provider]  potassium chloride SA (KLOR-CON M) 20 MEQ tablet Take 20 mEq by mouth daily.    [provider]  pregabalin (LYRICA) 100 MG capsule Take 100 mg by mouth 3 (three) times daily. 03/11/22   [provider]  PULMICORT FLEXHALER 90 MCG/ACT inhaler Inhale 1-2 puffs into the lungs 2 (two) times daily as needed (asthma).    [provider]  REPATHA SURECLICK 140 MG/ML SOAJ Inject 140 mg into the skin every 14 (fourteen) days. 01/25/23   [provider]  UBRELVY 50 MG TABS Take 50 mg by mouth daily as needed (migraines). 05/04/23   [provider]    Physical Exam    Vital Signs:  Kyle Gray does not have vital signs available for review today.  Given telephonic  nature of communication, physical exam is limited. AAOx3. NAD. Normal affect.  Speech and respirations are unlabored.  Accessory Clinical Findings    None  Assessment & Plan    1.  Preoperative Cardiovascular Risk Assessment:  According to the Revised Cardiac Risk Index (RCRI), his Perioperative Risk of Major Cardiac Event is (%): 0.9  His Functional Capacity in METs is: 9.46 according to the [employer] Activity Status Index (DASI). Therefore, based on ACC/AHA guidelines, patient would be at acceptable risk for the planned procedure without further cardiovascular testing.   The patient was advised that if he develops new symptoms prior to surgery to contact our office to arrange for a follow-up visit, and he verbalized understanding.  Patient has history of multiple CVAs w/ right vertebral stenting 02/12/2019, for which patient is on DAPT w/ Plavix and ASA. Recommendations for antiplatelet hold for surgery should come from managing provider (Neurology/PCP).   A copy of this note will be routed to requesting surgeon.  Time:   Today, I have spent 10 minutes with the patient with telehealth technology discussing medical history, symptoms, and management plan.     Hellen Shanley E Pearlene Teat, NP  10/22/2023, 2:41 PM
